# Patient Record
Sex: Female | Born: 1970
Health system: Southern US, Community
[De-identification: ages and names within clinical notes are randomized; demographics above are authoritative.]

## PROBLEM LIST (undated history)

## (undated) DIAGNOSIS — K219 Gastro-esophageal reflux disease without esophagitis: Secondary | ICD-10-CM

## (undated) DIAGNOSIS — J45909 Unspecified asthma, uncomplicated: Secondary | ICD-10-CM

## (undated) DIAGNOSIS — I10 Essential (primary) hypertension: Secondary | ICD-10-CM

## (undated) DIAGNOSIS — F419 Anxiety disorder, unspecified: Secondary | ICD-10-CM

## (undated) DIAGNOSIS — F32A Depression, unspecified: Secondary | ICD-10-CM

## (undated) DIAGNOSIS — F329 Major depressive disorder, single episode, unspecified: Secondary | ICD-10-CM

## (undated) DIAGNOSIS — C539 Malignant neoplasm of cervix uteri, unspecified: Secondary | ICD-10-CM

## (undated) DIAGNOSIS — I6522 Occlusion and stenosis of left carotid artery: Secondary | ICD-10-CM

## (undated) DIAGNOSIS — E119 Type 2 diabetes mellitus without complications: Secondary | ICD-10-CM

## (undated) DIAGNOSIS — M314 Aortic arch syndrome [Takayasu]: Secondary | ICD-10-CM

## (undated) DIAGNOSIS — E079 Disorder of thyroid, unspecified: Secondary | ICD-10-CM

## (undated) HISTORY — DX: Anxiety disorder, unspecified: F41.9

## (undated) HISTORY — PX: HEMORRHOID SURGERY: SHX153

## (undated) HISTORY — DX: Type 2 diabetes mellitus without complications: E11.9

## (undated) HISTORY — DX: Depression, unspecified: F32.A

## (undated) HISTORY — DX: Disorder of thyroid, unspecified: E07.9

---
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
## 2004-07-15 DIAGNOSIS — B181 Chronic viral hepatitis B without delta-agent: Secondary | ICD-10-CM

## 2004-07-15 DIAGNOSIS — B1910 Unspecified viral hepatitis B without hepatic coma: Secondary | ICD-10-CM | POA: Insufficient documentation

## 2004-07-15 HISTORY — DX: Unspecified viral hepatitis B without hepatic coma: B19.10

## 2004-07-15 HISTORY — DX: Chronic viral hepatitis B without delta-agent: B18.1

## 2011-11-08 DIAGNOSIS — C539 Malignant neoplasm of cervix uteri, unspecified: Secondary | ICD-10-CM | POA: Insufficient documentation

## 2011-11-08 HISTORY — DX: Malignant neoplasm of cervix uteri, unspecified: C53.9

## 2012-06-08 ENCOUNTER — Other Ambulatory Visit (HOSPITAL_BASED_OUTPATIENT_CLINIC_OR_DEPARTMENT_OTHER): Payer: Self-pay | Admitting: Family Medicine

## 2012-06-08 DIAGNOSIS — R0989 Other specified symptoms and signs involving the circulatory and respiratory systems: Secondary | ICD-10-CM

## 2012-06-11 ENCOUNTER — Ambulatory Visit (HOSPITAL_BASED_OUTPATIENT_CLINIC_OR_DEPARTMENT_OTHER): Payer: BC Managed Care – PPO

## 2012-06-13 ENCOUNTER — Ambulatory Visit (HOSPITAL_BASED_OUTPATIENT_CLINIC_OR_DEPARTMENT_OTHER)
Admission: RE | Admit: 2012-06-13 | Discharge: 2012-06-13 | Disposition: A | Discharge status: Home / Self Care | Payer: BC Managed Care – PPO | Source: Ambulatory Visit | Attending: Family Medicine | Admitting: Family Medicine

## 2012-06-13 DIAGNOSIS — I6529 Occlusion and stenosis of unspecified carotid artery: Secondary | ICD-10-CM | POA: Insufficient documentation

## 2012-06-13 DIAGNOSIS — I10 Essential (primary) hypertension: Secondary | ICD-10-CM | POA: Insufficient documentation

## 2012-06-13 DIAGNOSIS — R0989 Other specified symptoms and signs involving the circulatory and respiratory systems: Secondary | ICD-10-CM | POA: Insufficient documentation

## 2012-06-21 ENCOUNTER — Encounter (HOSPITAL_COMMUNITY): Payer: Self-pay | Admitting: Emergency Medicine

## 2012-06-21 ENCOUNTER — Emergency Department (HOSPITAL_COMMUNITY): Payer: BC Managed Care – PPO

## 2012-06-21 ENCOUNTER — Emergency Department (HOSPITAL_COMMUNITY)
Admission: EM | Admit: 2012-06-21 | Discharge: 2012-06-22 | Disposition: A | Discharge status: Home / Self Care | Payer: BC Managed Care – PPO | Attending: Emergency Medicine | Admitting: Emergency Medicine

## 2012-06-21 DIAGNOSIS — Z8541 Personal history of malignant neoplasm of cervix uteri: Secondary | ICD-10-CM | POA: Insufficient documentation

## 2012-06-21 DIAGNOSIS — R079 Chest pain, unspecified: Secondary | ICD-10-CM | POA: Insufficient documentation

## 2012-06-21 DIAGNOSIS — J45909 Unspecified asthma, uncomplicated: Secondary | ICD-10-CM | POA: Insufficient documentation

## 2012-06-21 DIAGNOSIS — Z7982 Long term (current) use of aspirin: Secondary | ICD-10-CM | POA: Insufficient documentation

## 2012-06-21 DIAGNOSIS — Z8679 Personal history of other diseases of the circulatory system: Secondary | ICD-10-CM | POA: Insufficient documentation

## 2012-06-21 DIAGNOSIS — Z79899 Other long term (current) drug therapy: Secondary | ICD-10-CM | POA: Insufficient documentation

## 2012-06-21 DIAGNOSIS — M314 Aortic arch syndrome [Takayasu]: Secondary | ICD-10-CM | POA: Insufficient documentation

## 2012-06-21 DIAGNOSIS — I10 Essential (primary) hypertension: Secondary | ICD-10-CM | POA: Insufficient documentation

## 2012-06-21 HISTORY — DX: Malignant neoplasm of cervix uteri, unspecified: C53.9

## 2012-06-21 HISTORY — DX: Essential (primary) hypertension: I10

## 2012-06-21 HISTORY — DX: Occlusion and stenosis of left carotid artery: I65.22

## 2012-06-21 HISTORY — DX: Unspecified asthma, uncomplicated: J45.909

## 2012-06-21 HISTORY — DX: Aortic arch syndrome (takayasu): M31.4

## 2012-06-21 LAB — COMPREHENSIVE METABOLIC PANEL
ALT: 32 U/L (ref 0–35)
AST: 26 U/L (ref 0–37)
Albumin: 4 g/dL (ref 3.5–5.2)
Alkaline Phosphatase: 113 U/L (ref 39–117)
BUN: 14 mg/dL (ref 6–23)
CO2: 26 mEq/L (ref 19–32)
Calcium: 9.7 mg/dL (ref 8.4–10.5)
Chloride: 101 mEq/L (ref 96–112)
Creatinine, Ser: 0.58 mg/dL (ref 0.50–1.10)
GFR calc Af Amer: >90 mL/min (ref >90)
GFR calc non Af Amer: >90 mL/min (ref >90)
Glucose, Bld: 116 mg/dL — ABNORMAL HIGH (ref 70–99)
Potassium: 3.4 mEq/L — ABNORMAL LOW (ref 3.5–5.1)
Sodium: 138 mEq/L (ref 135–145)
Total Bilirubin: 0.4 mg/dL (ref 0.3–1.2)
Total Protein: 8.1 g/dL (ref 6.0–8.3)

## 2012-06-21 LAB — SEDIMENTATION RATE: Sed Rate: 37 mm/hr — ABNORMAL HIGH (ref 0–22)

## 2012-06-21 LAB — CBC WITH DIFFERENTIAL/PLATELET
Basophils Absolute: 0 10*3/uL (ref 0.0–0.1)
Basophils Relative: 0 % (ref 0–1)
Eosinophils Absolute: 0 10*3/uL (ref 0.0–0.7)
Eosinophils Relative: 1 % (ref 0–5)
HCT: 37.9 % (ref 36.0–46.0)
Hemoglobin: 13 g/dL (ref 12.0–15.0)
Lymphocytes Relative: 20 % (ref 12–46)
Lymphs Abs: 0.6 10*3/uL — ABNORMAL LOW (ref 0.7–4.0)
MCH: 28.3 pg (ref 26.0–34.0)
MCHC: 34.3 g/dL (ref 30.0–36.0)
MCV: 82.4 fL (ref 78.0–100.0)
Monocytes Absolute: 0.3 10*3/uL (ref 0.1–1.0)
Monocytes Relative: 9 % (ref 3–12)
Neutro Abs: 2.1 10*3/uL (ref 1.7–7.7)
Neutrophils Relative %: 70 % (ref 43–77)
Platelets: 241 10*3/uL (ref 150–400)
RBC: 4.6 MIL/uL (ref 3.87–5.11)
RDW: 13.2 % (ref 11.5–15.5)
WBC: 3 10*3/uL — ABNORMAL LOW (ref 4.0–10.5)

## 2012-06-21 LAB — POCT I-STAT TROPONIN I: Troponin i, poc: 0.01 ng/mL (ref 0.00–0.08)

## 2012-06-21 MED ORDER — METHYLPREDNISOLONE SODIUM SUCC 125 MG IJ SOLR
125.0000 mg | Freq: Once | INTRAMUSCULAR | Status: AC
  Administered 2012-06-21: 125 mg via INTRAVENOUS
  Filled 2012-06-21: qty 2

## 2012-06-21 MED ORDER — IOHEXOL 350 MG/ML SOLN
100.0000 mL | Freq: Once | INTRAVENOUS | Status: AC | PRN
  Administered 2012-06-21: 100 mL via INTRAVENOUS

## 2012-06-21 NOTE — ED Notes (Signed)
Pt given sandwich and water per Dr. Conley Rolls.

## 2012-06-21 NOTE — ED Provider Notes (Signed)
History    42yF with chest heaviness. Onset about a weak ago. Vague sensation of pressure substernally. Intermittent w/o appreciable exacerbating factors. Does not radiate. No SOB. No fever or chills. No cough. No unusual leg pain or swelling. Paperwork with pt with question of Takayasu's arteritis based on recent carotid doppler. On review of symptoms pt c/o intermittent blurred vision b/l. Feels generally tired. Soreness in R neck. Occasionally arms feel heavy and numb. Hx of cervical CA s/p chemo/radiation. No unusual leg pain or swelling. Denies hx of DVT/PE.   CSN: [id number]  Arrival date & time 06/21/12  1819   First MD Initiated Contact with Patient 06/21/12 1910      Chief Complaint  Patient presents with  . Chest Pain    (Consider location/radiation/quality/duration/timing/severity/associated sxs/prior treatment) HPI  Past Medical History  Diagnosis Date  . Hypertension   . Cervical cancer   . Left carotid artery occlusion   . Takayasu's arteritis   . Asthma   . Cervical cancer     History reviewed. No pertinent past surgical history.  No family history on file.  History  Substance Use Topics  . Smoking status: Never Smoker   . Smokeless tobacco: Not on file  . Alcohol Use: No    OB History   Grav Para Term Preterm Abortions TAB SAB Ect Mult Living                  Review of Systems  All systems reviewed and negative, other than as noted in HPI.   Allergies  Review of patient's allergies indicates no known allergies.  Home Medications   Current Outpatient Rx  Name  Route  Sig  Dispense  Refill  . amLODipine (NORVASC) 5 MG tablet   Oral   Take 5 mg by mouth daily.         Marland Kitchen aspirin EC 81 MG tablet   Oral   Take 81 mg by mouth daily.         . calcium-vitamin D (OSCAL WITH D) 500-200 MG-UNIT per tablet   Oral   Take 1 tablet by mouth daily.         Marland Kitchen lisinopril (PRINIVIL,ZESTRIL) 20 MG tablet   Oral   Take 20 mg by mouth 2 (two)  times daily.         Marland Kitchen omeprazole (PRILOSEC) 20 MG capsule   Oral   Take 20 mg by mouth daily.         . potassium chloride (MICRO-K) 10 MEQ CR capsule   Oral   Take 10 mEq by mouth daily.           BP 200/81  Pulse 93  Temp(Src) 98.2 F (36.8 C)  Resp 17  SpO2 99%  Physical Exam  Nursing note and vitals reviewed. Constitutional: She is oriented to person, place, and time. She appears well-developed and well-nourished. No distress.  HENT:  Head: Normocephalic and atraumatic.  Eyes: Conjunctivae are normal. Right eye exhibits no discharge. Left eye exhibits no discharge.  Neck: Neck supple.  Cardiovascular: Normal rate, regular rhythm and normal heart sounds.  Exam reveals no gallop and no friction rub.   No murmur heard. Bilateral carotid bruits. No heart murmur appreciated. No abdominal bruit. I could not palpate a carotid pulse b/l. Good brachial pulses b/l.   Pulmonary/Chest: Effort normal and breath sounds normal. No respiratory distress.  Abdominal: Soft. She exhibits no distension. There is no tenderness.  Musculoskeletal: She exhibits no edema  and no tenderness.  Neurological: She is alert and oriented to person, place, and time. No cranial nerve deficit. She exhibits normal muscle tone. Coordination normal.  Skin: Skin is warm and dry. No rash noted.  No concerning skin lesions noted  Psychiatric: She has a normal mood and affect. Her behavior is normal. Thought content normal.    ED Course  Procedures (including critical care time)  Labs Reviewed  COMPREHENSIVE METABOLIC PANEL - Abnormal; Notable for the following:    Potassium 3.4 (*)    Glucose, Bld 116 (*)    All other components within normal limits  CBC WITH DIFFERENTIAL - Abnormal; Notable for the following:    WBC 3.0 (*)    Lymphs Abs 0.6 (*)    All other components within normal limits  SEDIMENTATION RATE - Abnormal; Notable for the following:    Sed Rate 37 (*)    All other components  within normal limits  C-REACTIVE PROTEIN  POCT I-STAT TROPONIN I   Dg Chest 2 View  06/21/2012  *RADIOLOGY REPORT*  Clinical Data: Mid chest pain.  CHEST - 2 VIEW  Comparison: None.  Findings: Lungs clear. Heart size upper normal. No pneumothorax or pleural effusion.  IMPRESSION: No acute finding.   Original Report Authenticated By: Holley Dexter, M.D.    Ct Angio Chest W/cm &/or Wo Cm  06/21/2012  *RADIOLOGY REPORT*  Clinical Data: Chest pain, concern for patchy Isovue arteritis  CT ANGIOGRAPHY CHEST  Technique:  Multidetector CT imaging of the chest using the standard protocol during bolus administration of intravenous contrast. Multiplanar reconstructed images including MIPs were obtained and reviewed to evaluate the vascular anatomy.  Contrast: OMNIPAQUE IOHEXOL 350 MG/ML SOLN  Comparison: Chest radiograph 06/21/2012  Findings: In the ascending aorta is normal caliber at 35 x 34 mm. There is circumferential calcification along the ascending aorta. There is inflammation along the aortic arch surrounding the right brachiocephalic artery and the origin of the left subclavian artery.  The right carotid artery has a separate origin from the aorta and does not opacify with contrast consistent with occlusion.  This is seen on the axial image 13 through six and coronal image 35.  There is inflammation surrounding the origin of the right common carotid artery with only a thin wisp of contrast extending through the right common carotid artery proximally.  The more distal artery is not evaluated.   The right proximal common carotid artery stenosis is best seen on axial image six and coronal image 30.  The right vertebral artery is patent.   The left vertebral artery originates from the left common carotid artery.  Descending thoracic aorta is normal caliber.  The coronary arteries are grossly normal.  No pericardial fluid.  There is no focal parenchymal pulmonary lesion.  No axillary lymphadenopathy.  No  mediastinal adenopathy.  Limited view of the upper abdomen is unremarkable.  IMPRESSION:  1.  Chronic complete occlusion of the left carotid artery at the origin secondary to inflammation / arteritis. 2.  Severe narrowing at the origin of the right common carotid artery. 3.  More generalized inflammation surrounding the origin of the brachiocephalic artery and left subclavian artery. 4.  Vertebral arteries are patent.  Findings conveyed to Dr. Juleen China on 06/21/2012 at 2210 hours   Original Report Authenticated By: Genevive Bi, M.D.    EKG:  Rhythm: sinus tachycardia Vent. rate 104 BPM PR interval 146 ms QRS duration 90 ms QT/QTc 350/460 ms ST segments: NS ST changes  1. Takayasu's arteritis       MDM  42 year old female with CP. Consider ACS, infectious, PE, aortic dissection, but doubt. Interesting case in that likely has Takayasu's arteritis. CT with occluded carotids bilaterally, but seems to be getting good compensatory vertebral flow. Pt with multiple vague complaints which may be atributable to this. Case discussed with vascular surgery and neurology. From vascular surgery standpoint, very rarely do they surgically intervene. Pt with no focal neuro deficits. Will start on steroids which will eventually need to be tapered.  Neurology recommending ASA.  Hypertension and will need to be very careful in reduction given tenuous cerebral flow. Very concerning appearing CT, but no acute intervention aside from steroids which can be done as outpt. For follow-up, rheumatology probably most appropriate and will defer to PCP for preference. Pt has already been referred to vascular surgeon at [facility] per papers which pt brought with her to ED.         Raeford Razor, MD 06/22/12 [phone]

## 2012-06-21 NOTE — ED Notes (Signed)
Her husband works out of town has child with her no family

## 2012-06-21 NOTE — ED Notes (Signed)
Pt presents from doctors office for further evaluation of generalized fatigue.  Pt had testing done that showed possible Takayasu arteritis- pt wants further evaluation of this- complaining of heaviness in chest at present, found sinus tach on monitor.  Pt receiving chemo and radiation at present for cervical cancer.  Pt has had increased fatigue and heaviness in her chest for the past week.  On cardiac monitor, NAD noted, will continue to monitor.

## 2012-06-21 NOTE — ED Notes (Signed)
Cp that started yesterday went to dr today had ekg and was found to have ? Takayasu artertis has been having chemo and radiation for cervical ca

## 2012-06-22 DIAGNOSIS — M314 Aortic arch syndrome [Takayasu]: Secondary | ICD-10-CM

## 2012-06-22 DIAGNOSIS — R079 Chest pain, unspecified: Secondary | ICD-10-CM

## 2012-06-22 LAB — C-REACTIVE PROTEIN: CRP: 0.7 mg/dL — ABNORMAL HIGH (ref <0.60)

## 2012-06-22 MED ORDER — ASPIRIN EC 325 MG PO TBEC: 325.0000 mg | DELAYED_RELEASE_TABLET | Freq: Every day | ORAL | Status: DC

## 2012-06-22 MED ORDER — PREDNISONE 50 MG PO TABS: 50.0000 mg | ORAL_TABLET | Freq: Every day | ORAL | Status: DC

## 2012-06-22 NOTE — Consult Note (Addendum)
Reason for Consult:Takayasu's arteritis Referring Physician: Juleen China, S  CC: Generalized fatigue  History is obtained from:Patient  HPI: Molly Hardin is a 42 y.o. female with a history of approximately 3 months of generalized fatigue, though worsened over the past few days. She states that she gets a bilateral sensation of heaviness and feels lightheaded. This then will pass. She denies vision loss, numbness, unilateral weakness, or other unilateral symptoms. She was evaluated by her PCP and had an ultrasound which prompted her referral th the emergency department. Here, she had a CT angiogram of the chest which shows occlusion of the left CCA and almost complete occlusion with a tiny persistent lumen in the right CCA. She has patent vertebrals as visualized on this study.   Of note, she has undergone radiation and chemotherapy for cervical cancer, but thinks that the radiation was localized to her pelvis.   ROS: A 14 point ROS was performed and is negative except as noted in the HPI.  Past Medical History  Diagnosis Date  . Hypertension   . Cervical cancer   . Left carotid artery occlusion   . Takayasu's arteritis   . Asthma   . Cervical cancer     Exam: Current vital signs: BP 105/63  Pulse 97  Temp(Src) 98.2 F (36.8 C)  Resp 17  SpO2 99% Vital signs in last 24 hours: Temp:  [98.2 F (36.8 C)] 98.2 F (36.8 C) (03/06 1841) Pulse Rate:  [93-99] 97 (03/06 2330) Resp:  [16-17] 17 (03/06 2037) BP: (105-204)/(63-106) 105/63 mmHg (03/06 2330) SpO2:  [99 %-100 %] 99 % (03/06 2330)  General: in bed, nad CV: RRR Mental Status: Patient is awake, alert, oriented to person, place, month, year, and situation. Immediate and remote memory are intact. Patient is able to give a clear and coherent history. No signs of aphasia.  Cranial Nerves: II: Visual Fields are full. Pupils are equal, round, and reactive to light.  Discs are sharp. III,IV, VI: EOMI without ptosis or diploplia.   V: Facial sensation is symmetric to temperature VII: Facial movement is symmetric.  VIII: hearing is intact to voice X: Uvula elevates symmetrically XI: Shoulder shrug is symmetric. XII: tongue is midline without atrophy or fasciculations.  Motor: Tone is normal. Bulk is normal. 5/5 strength was present in all four extremities.  Sensory: Sensation is symmetric to light touch and temperature in the arms and legs. Deep Tendon Reflexes: 2+ and symmetric in the biceps and patellae.  Plantars: Toes are downgoing bilaterally.  Cerebellar: FNF and HKS are intact bilaterally Gait: Patient has a stable casual gait. Able to tandem.   I have reviewed labs in epic and the results pertinent to this consultation are: ESR elevated at 37.  Mild leukopenia  I have reviewed the images obtained:CTA chest. Occluded leftcarotid,  tiny lumen remains patent in right carotid. Patent vertebrals at this level.   Impression: 42 yo F with CT evidence of inflamation surrounding large vessels as they come off of the aorta consistent with Takayasu's arteritis. Though, given her imaging, I do feel that she is at risk for stroke, I do not know of any specific therapy to offer her as an inpatient from a neurological perspective, especially if vascular surgery does not feel that they have any interventions to offer. I did advise her to return to the ER immediately with any signs or symptoms of stroke(unilateral weakness, numbness, vision loss difficulty talking, etc).   Though I would be hesitant to be too aggressive  with BP management, she appeaerd to be perfusing well with a systolic of 105 while I was in the room.   Recommendations: 1) Patient needs to establish with rheumatology 2) Agree with IM recommendation of starting prednisone 1mg /kg 3) There have been limited comparative trials with antiplatelets in TA, and it does appear to confer some benefit. Given her degree of stenosis, I would advocate continuing  antiplatelet therapy.  4) PPI for GI prophylaxis while on steroids.     Ritta Slot, MD [facility] [phone]  If 7pm- 7am, please page neurology on call at [phone].

## 2012-06-22 NOTE — Consult Note (Signed)
[facility] History and Physical  Molly Hardin RUE:[id number] DOB: 06/01/1970    PCP:   [name], MD.  Chief Complaint: Weakness and upper extremity pain.  HPI: Molly Hardin is an 42 y.o. female with history of hypertension, cervical cancer underwent chemotherapy and radiation therapy, asthma, presents to her primary care physician with no history of progressive fatigue with upper extremity pain, and general malaise. Her primary care physician obtained a carotid Doppler which shows inflammation and occlusion of her carotid artery consistent with Takayasu's arteritis. She was referred to the emergency room whereupon a CT angiogram of the chest revealed occlusion of the left CCA and almost complete occlusion with a tiny persistent lumen in the right CCA. She has patent vertebrals as visualized on this study.  She denied any slurred speech visual changes, facial paresthesia, upper or lower extremity focal weakness. Hospital was asked for medical consult on this patient.  EDP has already consulted vascular surgery and Dr. Valentina Shaggy did not feel any surgical intervention is indicated.   Rewiew of Systems:  Constitutional: Negative for significant weight loss or weight gain Eyes: Negative for eye pain, redness and discharge, diplopia, visual changes, or flashes of light. ENMT: Negative for ear pain, hoarseness, nasal congestion, sinus pressure and sore throat. No headaches; tinnitus, drooling, or problem swallowing. Cardiovascular: Negative for chest pain, palpitations, diaphoresis, dyspnea and peripheral edema. ; No orthopnea, PND Respiratory: Negative for cough, hemoptysis, wheezing and stridor. No pleuritic chestpain. Gastrointestinal: Negative for nausea, vomiting, diarrhea, constipation, abdominal pain, melena, blood in stool, hematemesis, jaundice and rectal bleeding.    Genitourinary: Negative for frequency, dysuria, incontinence,flank pain and hematuria; Musculoskeletal: Negative  for swelling and trauma.;  Skin: . Negative for pruritus, rash, abrasions, bruising and skin lesion.; ulcerations Neuro: Negative for headache, lightheadedness and neck stiffness. Negative for weakness, altered level of consciousness , altered mental status, extremity weakness, burning feet, involuntary movement, seizure and syncope.  Psych: negative for anxiety, depression, insomnia, tearfulness, panic attacks, hallucinations, paranoia, suicidal or homicidal ideation    Past Medical History  Diagnosis Date  . Hypertension   . Cervical cancer   . Left carotid artery occlusion   . Takayasu's arteritis   . Asthma   . Cervical cancer     History reviewed. No pertinent past surgical history.  Medications:  HOME MEDS: Prior to Admission medications   Medication Sig Start Date End Date Taking? Authorizing Provider  amLODipine (NORVASC) 5 MG tablet Take 5 mg by mouth daily.   Yes Historical Provider, MD  aspirin EC 81 MG tablet Take 81 mg by mouth daily.   Yes Historical Provider, MD  calcium-vitamin D (OSCAL WITH D) 500-200 MG-UNIT per tablet Take 1 tablet by mouth daily.   Yes Historical Provider, MD  lisinopril (PRINIVIL,ZESTRIL) 20 MG tablet Take 20 mg by mouth 2 (two) times daily.   Yes Historical Provider, MD  omeprazole (PRILOSEC) 20 MG capsule Take 20 mg by mouth daily.   Yes Historical Provider, MD  potassium chloride (MICRO-K) 10 MEQ CR capsule Take 10 mEq by mouth daily.   Yes Historical Provider, MD  aspirin EC 325 MG tablet Take 1 tablet (325 mg total) by mouth daily. 06/22/12   Raeford Razor, MD  predniSONE (DELTASONE) 50 MG tablet Take 1 tablet (50 mg total) by mouth daily. 06/22/12   Raeford Razor, MD     Allergies:  No Known Allergies  Social History:   reports that she has never smoked. She does not have any smokeless  tobacco history on file. She reports that she does not drink alcohol or use illicit drugs.  Family History: No family history on file.   Physical  Exam: Filed Vitals:   06/21/12 2032 06/21/12 2035 06/21/12 2037 06/21/12 2330  BP: 197/72 200/81  105/63  Pulse:   93 97  Temp:      Resp:   17   SpO2:   99% 99%   Blood pressure 105/63, pulse 97, temperature 98.2 F (36.8 C), resp. rate 17, SpO2 99.00%.  GEN:  Pleasant patient lying in the stretcher in no acute distress; cooperative with exam. PSYCH:  alert and oriented x4; does not appear anxious or depressed; affect is appropriate. HEENT: Mucous membranes pink and anicteric; PERRLA; EOM intact; no cervical lymphadenopathy nor thyromegaly she has bilateral carotid bruits with left greater than right no JVD; There were no stridor. Neck is very supple. Breasts:: Not examined CHEST WALL: No tenderness CHEST: Normal respiration, clear to auscultation bilaterally.  HEART: Regular rate and rhythm.  There are no murmur, rub, or gallops.   BACK: No kyphosis or scoliosis; no CVA tenderness ABDOMEN: soft and non-tender; no masses, no organomegaly, normal abdominal bowel sounds; no pannus; no intertriginous candida. There is no rebound and no distention. Rectal Exam: Not done EXTREMITIES: No bone or joint deformity; age-appropriate arthropathy of the hands and knees; no edema; no ulcerations.  There is no calf tenderness. Genitalia: not examined PULSES: She has decreased radial pulses bilaterally SKIN: Normal hydration no rash or ulceration CNS: Cranial nerves 2-12 grossly intact no focal lateralizing neurologic deficit.  Speech is fluent; uvula elevated with phonation, facial symmetry and tongue midline. DTR are normal bilaterally, cerebella exam is intact, barbinski is negative and strengths are equaled bilaterally.  No sensory loss.   Labs on Admission:  Basic Metabolic Panel:  Recent Labs Lab 06/21/12 1844  NA 138  K 3.4*  CL 101  CO2 26  GLUCOSE 116*  BUN 14  CREATININE 0.58  CALCIUM 9.7   Liver Function Tests:  Recent Labs Lab 06/21/12 1844  AST 26  ALT 32  ALKPHOS 113   BILITOT 0.4  PROT 8.1  ALBUMIN 4.0   No results found for this basename: LIPASE, AMYLASE,  in the last 168 hours No results found for this basename: AMMONIA,  in the last 168 hours CBC:  Recent Labs Lab 06/21/12 1844  WBC 3.0*  NEUTROABS 2.1  HGB 13.0  HCT 37.9  MCV 82.4  PLT 241   Cardiac Enzymes: No results found for this basename: CKTOTAL, CKMB, CKMBINDEX, TROPONINI,  in the last 168 hours  CBG: No results found for this basename: GLUCAP,  in the last 168 hours   Radiological Exams on Admission: Dg Chest 2 View  06/21/2012  *RADIOLOGY REPORT*  Clinical Data: Mid chest pain.  CHEST - 2 VIEW  Comparison: None.  Findings: Lungs clear. Heart size upper normal. No pneumothorax or pleural effusion.  IMPRESSION: No acute finding.   Original Report Authenticated By: Holley Dexter, M.D.    Ct Angio Chest W/cm &/or Wo Cm  06/21/2012  *RADIOLOGY REPORT*  Clinical Data: Chest pain, concern for patchy Isovue arteritis  CT ANGIOGRAPHY CHEST  Technique:  Multidetector CT imaging of the chest using the standard protocol during bolus administration of intravenous contrast. Multiplanar reconstructed images including MIPs were obtained and reviewed to evaluate the vascular anatomy.  Contrast: OMNIPAQUE IOHEXOL 350 MG/ML SOLN  Comparison: Chest radiograph 06/21/2012  Findings: In the ascending aorta is normal  caliber at 35 x 34 mm. There is circumferential calcification along the ascending aorta. There is inflammation along the aortic arch surrounding the right brachiocephalic artery and the origin of the left subclavian artery.  The right carotid artery has a separate origin from the aorta and does not opacify with contrast consistent with occlusion.  This is seen on the axial image 13 through six and coronal image 35.  There is inflammation surrounding the origin of the right common carotid artery with only a thin wisp of contrast extending through the right common carotid artery proximally.   The more distal artery is not evaluated.   The right proximal common carotid artery stenosis is best seen on axial image six and coronal image 30.  The right vertebral artery is patent.   The left vertebral artery originates from the left common carotid artery.  Descending thoracic aorta is normal caliber.  The coronary arteries are grossly normal.  No pericardial fluid.  There is no focal parenchymal pulmonary lesion.  No axillary lymphadenopathy.  No mediastinal adenopathy.  Limited view of the upper abdomen is unremarkable.  IMPRESSION:  1.  Chronic complete occlusion of the left carotid artery at the origin secondary to inflammation / arteritis. 2.  Severe narrowing at the origin of the right common carotid artery. 3.  More generalized inflammation surrounding the origin of the brachiocephalic artery and left subclavian artery. 4.  Vertebral arteries are patent.  Findings conveyed to Dr. Juleen China on 06/21/2012 at 2210 hours   Original Report Authenticated By: Genevive Bi, M.D.     Assessment/Plan:  This patient most likely has Takayasu's arteritis as evidenced by chronic inflammation and occlusion of large size arteries.  She will need by mouth prednisone which I recommend 60 mg per day at least. I would recommend consulting neurology to see if any other intervention needed as inpatient. If not, I would recommend starting her on prednisone, give specific indication as to when to return to the emergency room. She should followup with her primary care physician and a referral to rheumatologist is appropriate. Thank you for consulting me on this very interesting patient. She does have elevated blood pressure but I would recommend to allow permissive hypertension given her known occlusion with decreased cerebral blood flow.   Other plans as per orders.  Code Status: FULL Unk Lightning, MD. [facility] Pager [phone] 7pm to 7am.  06/22/2012, 1:20 AM

## 2012-07-26 ENCOUNTER — Other Ambulatory Visit: Payer: Self-pay | Admitting: Gastroenterology

## 2012-07-26 DIAGNOSIS — R1310 Dysphagia, unspecified: Secondary | ICD-10-CM

## 2012-08-03 ENCOUNTER — Other Ambulatory Visit: Payer: Self-pay | Admitting: Gastroenterology

## 2012-08-03 ENCOUNTER — Ambulatory Visit
Admission: RE | Admit: 2012-08-03 | Discharge: 2012-08-03 | Disposition: A | Discharge status: Home / Self Care | Payer: BC Managed Care – PPO | Source: Ambulatory Visit | Attending: Gastroenterology | Admitting: Gastroenterology

## 2012-08-03 DIAGNOSIS — R1310 Dysphagia, unspecified: Secondary | ICD-10-CM

## 2013-01-11 ENCOUNTER — Other Ambulatory Visit: Payer: Self-pay | Admitting: Gastroenterology

## 2013-01-11 DIAGNOSIS — R109 Unspecified abdominal pain: Secondary | ICD-10-CM

## 2013-01-18 ENCOUNTER — Ambulatory Visit
Admission: RE | Admit: 2013-01-18 | Discharge: 2013-01-18 | Disposition: A | Discharge status: Home / Self Care | Payer: BC Managed Care – PPO | Source: Ambulatory Visit | Attending: Gastroenterology | Admitting: Gastroenterology

## 2013-01-18 DIAGNOSIS — R109 Unspecified abdominal pain: Secondary | ICD-10-CM

## 2013-04-22 DIAGNOSIS — M314 Aortic arch syndrome [Takayasu]: Secondary | ICD-10-CM

## 2013-04-22 HISTORY — DX: Aortic arch syndrome (takayasu): M31.4

## 2013-11-25 ENCOUNTER — Other Ambulatory Visit: Payer: Self-pay | Admitting: Family Medicine

## 2013-11-25 DIAGNOSIS — G44009 Cluster headache syndrome, unspecified, not intractable: Secondary | ICD-10-CM

## 2013-11-30 ENCOUNTER — Ambulatory Visit
Admission: RE | Admit: 2013-11-30 | Discharge: 2013-11-30 | Disposition: A | Discharge status: Home / Self Care | Payer: BC Managed Care – PPO | Source: Ambulatory Visit | Attending: Family Medicine | Admitting: Family Medicine

## 2013-11-30 DIAGNOSIS — G44009 Cluster headache syndrome, unspecified, not intractable: Secondary | ICD-10-CM

## 2014-04-24 ENCOUNTER — Other Ambulatory Visit: Payer: Self-pay | Admitting: Family Medicine

## 2014-04-24 DIAGNOSIS — Z8541 Personal history of malignant neoplasm of cervix uteri: Secondary | ICD-10-CM

## 2014-04-24 DIAGNOSIS — R1900 Intra-abdominal and pelvic swelling, mass and lump, unspecified site: Secondary | ICD-10-CM

## 2014-05-07 ENCOUNTER — Ambulatory Visit
Admission: RE | Admit: 2014-05-07 | Discharge: 2014-05-07 | Disposition: A | Discharge status: Home / Self Care | Payer: BLUE CROSS/BLUE SHIELD | Source: Ambulatory Visit | Attending: Family Medicine | Admitting: Family Medicine

## 2014-05-07 DIAGNOSIS — Z8541 Personal history of malignant neoplasm of cervix uteri: Secondary | ICD-10-CM

## 2014-05-07 DIAGNOSIS — R1900 Intra-abdominal and pelvic swelling, mass and lump, unspecified site: Secondary | ICD-10-CM

## 2014-05-07 MED ORDER — IOHEXOL 300 MG/ML  SOLN
100.0000 mL | Freq: Once | INTRAMUSCULAR | Status: AC | PRN
  Administered 2014-05-07: 100 mL via INTRAVENOUS

## 2014-06-06 ENCOUNTER — Encounter (HOSPITAL_BASED_OUTPATIENT_CLINIC_OR_DEPARTMENT_OTHER): Payer: BC Managed Care – PPO

## 2014-08-12 ENCOUNTER — Encounter (HOSPITAL_BASED_OUTPATIENT_CLINIC_OR_DEPARTMENT_OTHER): Payer: Medicaid Other

## 2014-09-05 ENCOUNTER — Ambulatory Visit (HOSPITAL_BASED_OUTPATIENT_CLINIC_OR_DEPARTMENT_OTHER): Payer: BLUE CROSS/BLUE SHIELD | Attending: Family Medicine

## 2015-06-08 ENCOUNTER — Ambulatory Visit
Admission: RE | Admit: 2015-06-08 | Discharge: 2015-06-08 | Disposition: A | Discharge status: Home / Self Care | Payer: BLUE CROSS/BLUE SHIELD | Source: Ambulatory Visit | Attending: Family Medicine | Admitting: Family Medicine

## 2015-06-08 ENCOUNTER — Other Ambulatory Visit: Payer: Self-pay | Admitting: Family Medicine

## 2015-06-08 DIAGNOSIS — M546 Pain in thoracic spine: Secondary | ICD-10-CM

## 2015-06-18 DIAGNOSIS — I10 Essential (primary) hypertension: Secondary | ICD-10-CM | POA: Diagnosis not present

## 2015-07-22 DIAGNOSIS — E876 Hypokalemia: Secondary | ICD-10-CM

## 2015-07-22 DIAGNOSIS — R109 Unspecified abdominal pain: Secondary | ICD-10-CM

## 2015-07-22 DIAGNOSIS — O24419 Gestational diabetes mellitus in pregnancy, unspecified control: Secondary | ICD-10-CM

## 2015-07-22 DIAGNOSIS — K2100 Gastro-esophageal reflux disease with esophagitis, without bleeding: Secondary | ICD-10-CM | POA: Insufficient documentation

## 2015-07-22 DIAGNOSIS — R1310 Dysphagia, unspecified: Secondary | ICD-10-CM

## 2015-07-22 DIAGNOSIS — R0989 Other specified symptoms and signs involving the circulatory and respiratory systems: Secondary | ICD-10-CM | POA: Insufficient documentation

## 2015-07-22 DIAGNOSIS — M797 Fibromyalgia: Secondary | ICD-10-CM

## 2015-07-22 DIAGNOSIS — N912 Amenorrhea, unspecified: Secondary | ICD-10-CM

## 2015-07-22 DIAGNOSIS — M545 Low back pain, unspecified: Secondary | ICD-10-CM

## 2015-07-22 DIAGNOSIS — I10 Essential (primary) hypertension: Secondary | ICD-10-CM

## 2015-07-22 HISTORY — DX: Amenorrhea, unspecified: N91.2

## 2015-07-22 HISTORY — DX: Other specified symptoms and signs involving the circulatory and respiratory systems: R09.89

## 2015-07-22 HISTORY — DX: Fibromyalgia: M79.7

## 2015-07-22 HISTORY — DX: Unspecified abdominal pain: R10.9

## 2015-07-22 HISTORY — DX: Low back pain, unspecified: M54.50

## 2015-07-22 HISTORY — DX: Dysphagia, unspecified: R13.10

## 2015-07-22 HISTORY — DX: Gestational diabetes mellitus in pregnancy, unspecified control: O24.419

## 2015-07-22 HISTORY — DX: Essential (primary) hypertension: I10

## 2015-07-22 HISTORY — DX: Gastro-esophageal reflux disease with esophagitis, without bleeding: K21.00

## 2015-07-22 HISTORY — DX: Hypokalemia: E87.6

## 2015-08-13 DIAGNOSIS — R609 Edema, unspecified: Secondary | ICD-10-CM | POA: Diagnosis not present

## 2015-08-13 DIAGNOSIS — M314 Aortic arch syndrome [Takayasu]: Secondary | ICD-10-CM | POA: Diagnosis not present

## 2015-08-13 DIAGNOSIS — I10 Essential (primary) hypertension: Secondary | ICD-10-CM | POA: Diagnosis not present

## 2015-09-26 DIAGNOSIS — R300 Dysuria: Secondary | ICD-10-CM | POA: Diagnosis not present

## 2015-09-26 DIAGNOSIS — R42 Dizziness and giddiness: Secondary | ICD-10-CM | POA: Diagnosis not present

## 2015-09-26 DIAGNOSIS — M546 Pain in thoracic spine: Secondary | ICD-10-CM | POA: Diagnosis not present

## 2015-09-26 DIAGNOSIS — R5383 Other fatigue: Secondary | ICD-10-CM | POA: Diagnosis not present

## 2015-10-19 DIAGNOSIS — Z79899 Other long term (current) drug therapy: Secondary | ICD-10-CM

## 2015-10-19 DIAGNOSIS — R12 Heartburn: Secondary | ICD-10-CM

## 2015-10-19 HISTORY — DX: Heartburn: R12

## 2015-10-19 HISTORY — DX: Other long term (current) drug therapy: Z79.899

## 2016-03-18 DIAGNOSIS — I517 Cardiomegaly: Secondary | ICD-10-CM | POA: Insufficient documentation

## 2016-03-18 HISTORY — DX: Cardiomegaly: I51.7

## 2016-03-21 DIAGNOSIS — M2550 Pain in unspecified joint: Secondary | ICD-10-CM | POA: Diagnosis not present

## 2016-03-21 DIAGNOSIS — H538 Other visual disturbances: Secondary | ICD-10-CM | POA: Diagnosis not present

## 2016-03-21 DIAGNOSIS — R739 Hyperglycemia, unspecified: Secondary | ICD-10-CM | POA: Diagnosis not present

## 2016-03-21 DIAGNOSIS — R0602 Shortness of breath: Secondary | ICD-10-CM | POA: Diagnosis not present

## 2016-03-21 DIAGNOSIS — Z79899 Other long term (current) drug therapy: Secondary | ICD-10-CM | POA: Diagnosis not present

## 2016-03-21 DIAGNOSIS — I10 Essential (primary) hypertension: Secondary | ICD-10-CM | POA: Diagnosis not present

## 2016-03-21 DIAGNOSIS — M314 Aortic arch syndrome [Takayasu]: Secondary | ICD-10-CM | POA: Diagnosis not present

## 2016-03-21 DIAGNOSIS — I456 Pre-excitation syndrome: Secondary | ICD-10-CM | POA: Diagnosis not present

## 2016-03-28 DIAGNOSIS — R0600 Dyspnea, unspecified: Secondary | ICD-10-CM | POA: Diagnosis not present

## 2016-04-04 DIAGNOSIS — H5203 Hypermetropia, bilateral: Secondary | ICD-10-CM | POA: Diagnosis not present

## 2016-04-04 DIAGNOSIS — H52203 Unspecified astigmatism, bilateral: Secondary | ICD-10-CM | POA: Diagnosis not present

## 2016-04-04 DIAGNOSIS — M314 Aortic arch syndrome [Takayasu]: Secondary | ICD-10-CM | POA: Diagnosis not present

## 2016-04-04 DIAGNOSIS — H16223 Keratoconjunctivitis sicca, not specified as Sjogren's, bilateral: Secondary | ICD-10-CM | POA: Diagnosis not present

## 2016-04-04 DIAGNOSIS — H524 Presbyopia: Secondary | ICD-10-CM | POA: Diagnosis not present

## 2016-04-04 DIAGNOSIS — H43393 Other vitreous opacities, bilateral: Secondary | ICD-10-CM | POA: Diagnosis not present

## 2016-04-04 DIAGNOSIS — Z7952 Long term (current) use of systemic steroids: Secondary | ICD-10-CM | POA: Diagnosis not present

## 2016-05-23 DIAGNOSIS — G8929 Other chronic pain: Secondary | ICD-10-CM

## 2016-05-23 DIAGNOSIS — B181 Chronic viral hepatitis B without delta-agent: Secondary | ICD-10-CM | POA: Diagnosis not present

## 2016-05-23 DIAGNOSIS — Z79899 Other long term (current) drug therapy: Secondary | ICD-10-CM | POA: Diagnosis not present

## 2016-05-23 DIAGNOSIS — M314 Aortic arch syndrome [Takayasu]: Secondary | ICD-10-CM | POA: Diagnosis not present

## 2016-05-23 DIAGNOSIS — M546 Pain in thoracic spine: Secondary | ICD-10-CM | POA: Diagnosis not present

## 2016-05-23 HISTORY — DX: Other chronic pain: G89.29

## 2016-06-01 DIAGNOSIS — R0600 Dyspnea, unspecified: Secondary | ICD-10-CM | POA: Diagnosis not present

## 2016-08-11 DIAGNOSIS — M545 Low back pain: Secondary | ICD-10-CM | POA: Diagnosis not present

## 2016-08-11 DIAGNOSIS — R7303 Prediabetes: Secondary | ICD-10-CM | POA: Diagnosis not present

## 2016-08-11 DIAGNOSIS — Z79899 Other long term (current) drug therapy: Secondary | ICD-10-CM | POA: Diagnosis not present

## 2016-08-11 DIAGNOSIS — I10 Essential (primary) hypertension: Secondary | ICD-10-CM | POA: Diagnosis not present

## 2016-08-22 DIAGNOSIS — Z79899 Other long term (current) drug therapy: Secondary | ICD-10-CM | POA: Diagnosis not present

## 2016-08-22 DIAGNOSIS — M546 Pain in thoracic spine: Secondary | ICD-10-CM | POA: Diagnosis not present

## 2016-08-22 DIAGNOSIS — M314 Aortic arch syndrome [Takayasu]: Secondary | ICD-10-CM | POA: Diagnosis not present

## 2016-08-22 DIAGNOSIS — G8929 Other chronic pain: Secondary | ICD-10-CM | POA: Diagnosis not present

## 2016-11-03 DIAGNOSIS — Z780 Asymptomatic menopausal state: Secondary | ICD-10-CM | POA: Diagnosis not present

## 2016-11-03 DIAGNOSIS — Z7952 Long term (current) use of systemic steroids: Secondary | ICD-10-CM | POA: Diagnosis not present

## 2016-11-15 DIAGNOSIS — E785 Hyperlipidemia, unspecified: Secondary | ICD-10-CM

## 2016-11-15 DIAGNOSIS — E119 Type 2 diabetes mellitus without complications: Secondary | ICD-10-CM

## 2016-11-15 DIAGNOSIS — R079 Chest pain, unspecified: Secondary | ICD-10-CM

## 2016-11-15 DIAGNOSIS — E782 Mixed hyperlipidemia: Secondary | ICD-10-CM | POA: Insufficient documentation

## 2016-11-15 HISTORY — DX: Hyperlipidemia, unspecified: E78.5

## 2016-11-15 HISTORY — DX: Mixed hyperlipidemia: E78.2

## 2016-11-15 HISTORY — DX: Type 2 diabetes mellitus without complications: E11.9

## 2016-11-15 HISTORY — DX: Chest pain, unspecified: R07.9

## 2016-11-16 DIAGNOSIS — R079 Chest pain, unspecified: Secondary | ICD-10-CM | POA: Diagnosis not present

## 2016-11-17 DIAGNOSIS — J45909 Unspecified asthma, uncomplicated: Secondary | ICD-10-CM | POA: Diagnosis not present

## 2016-11-17 DIAGNOSIS — I10 Essential (primary) hypertension: Secondary | ICD-10-CM | POA: Diagnosis not present

## 2016-11-17 DIAGNOSIS — L219 Seborrheic dermatitis, unspecified: Secondary | ICD-10-CM | POA: Diagnosis not present

## 2016-11-17 DIAGNOSIS — F411 Generalized anxiety disorder: Secondary | ICD-10-CM | POA: Diagnosis not present

## 2016-11-22 DIAGNOSIS — M314 Aortic arch syndrome [Takayasu]: Secondary | ICD-10-CM | POA: Diagnosis not present

## 2016-11-22 DIAGNOSIS — Z79899 Other long term (current) drug therapy: Secondary | ICD-10-CM | POA: Diagnosis not present

## 2016-12-21 DIAGNOSIS — Z79899 Other long term (current) drug therapy: Secondary | ICD-10-CM | POA: Diagnosis not present

## 2016-12-21 DIAGNOSIS — M314 Aortic arch syndrome [Takayasu]: Secondary | ICD-10-CM | POA: Diagnosis not present

## 2017-01-03 DIAGNOSIS — M314 Aortic arch syndrome [Takayasu]: Secondary | ICD-10-CM | POA: Diagnosis not present

## 2017-01-03 DIAGNOSIS — R1310 Dysphagia, unspecified: Secondary | ICD-10-CM | POA: Diagnosis not present

## 2017-01-03 DIAGNOSIS — E7800 Pure hypercholesterolemia, unspecified: Secondary | ICD-10-CM | POA: Diagnosis not present

## 2017-01-30 DIAGNOSIS — Z8541 Personal history of malignant neoplasm of cervix uteri: Secondary | ICD-10-CM | POA: Diagnosis not present

## 2017-01-30 DIAGNOSIS — R5383 Other fatigue: Secondary | ICD-10-CM | POA: Diagnosis not present

## 2017-01-30 DIAGNOSIS — E559 Vitamin D deficiency, unspecified: Secondary | ICD-10-CM | POA: Diagnosis not present

## 2017-01-30 DIAGNOSIS — R7303 Prediabetes: Secondary | ICD-10-CM | POA: Diagnosis not present

## 2017-01-30 DIAGNOSIS — E0590 Thyrotoxicosis, unspecified without thyrotoxic crisis or storm: Secondary | ICD-10-CM | POA: Diagnosis not present

## 2017-01-30 DIAGNOSIS — Z23 Encounter for immunization: Secondary | ICD-10-CM | POA: Diagnosis not present

## 2017-01-30 DIAGNOSIS — K649 Unspecified hemorrhoids: Secondary | ICD-10-CM | POA: Diagnosis not present

## 2017-01-30 DIAGNOSIS — Z79899 Other long term (current) drug therapy: Secondary | ICD-10-CM | POA: Diagnosis not present

## 2017-01-30 DIAGNOSIS — O99810 Abnormal glucose complicating pregnancy: Secondary | ICD-10-CM | POA: Diagnosis not present

## 2017-01-30 DIAGNOSIS — I10 Essential (primary) hypertension: Secondary | ICD-10-CM | POA: Diagnosis not present

## 2017-02-15 DIAGNOSIS — K6289 Other specified diseases of anus and rectum: Secondary | ICD-10-CM | POA: Diagnosis not present

## 2017-02-21 DIAGNOSIS — K625 Hemorrhage of anus and rectum: Secondary | ICD-10-CM | POA: Diagnosis not present

## 2017-02-21 DIAGNOSIS — K648 Other hemorrhoids: Secondary | ICD-10-CM | POA: Diagnosis not present

## 2017-02-21 DIAGNOSIS — K6289 Other specified diseases of anus and rectum: Secondary | ICD-10-CM | POA: Diagnosis not present

## 2017-02-21 DIAGNOSIS — Z01818 Encounter for other preprocedural examination: Secondary | ICD-10-CM | POA: Diagnosis not present

## 2017-03-14 DIAGNOSIS — K635 Polyp of colon: Secondary | ICD-10-CM | POA: Diagnosis not present

## 2017-04-20 DIAGNOSIS — K625 Hemorrhage of anus and rectum: Secondary | ICD-10-CM | POA: Diagnosis not present

## 2017-04-20 DIAGNOSIS — K6289 Other specified diseases of anus and rectum: Secondary | ICD-10-CM | POA: Diagnosis not present

## 2017-05-04 DIAGNOSIS — E0590 Thyrotoxicosis, unspecified without thyrotoxic crisis or storm: Secondary | ICD-10-CM

## 2017-05-04 HISTORY — DX: Thyrotoxicosis, unspecified without thyrotoxic crisis or storm: E05.90

## 2017-05-09 DIAGNOSIS — K635 Polyp of colon: Secondary | ICD-10-CM | POA: Diagnosis not present

## 2017-05-09 DIAGNOSIS — K648 Other hemorrhoids: Secondary | ICD-10-CM | POA: Diagnosis not present

## 2017-05-09 DIAGNOSIS — Z01818 Encounter for other preprocedural examination: Secondary | ICD-10-CM | POA: Diagnosis not present

## 2017-05-19 DIAGNOSIS — K635 Polyp of colon: Secondary | ICD-10-CM | POA: Diagnosis not present

## 2017-05-31 DIAGNOSIS — K635 Polyp of colon: Secondary | ICD-10-CM | POA: Diagnosis not present

## 2017-05-31 DIAGNOSIS — K6289 Other specified diseases of anus and rectum: Secondary | ICD-10-CM | POA: Diagnosis not present

## 2017-07-25 DIAGNOSIS — E0590 Thyrotoxicosis, unspecified without thyrotoxic crisis or storm: Secondary | ICD-10-CM | POA: Diagnosis not present

## 2017-07-28 DIAGNOSIS — K648 Other hemorrhoids: Secondary | ICD-10-CM | POA: Diagnosis not present

## 2017-07-28 DIAGNOSIS — K6289 Other specified diseases of anus and rectum: Secondary | ICD-10-CM | POA: Diagnosis not present

## 2017-07-28 DIAGNOSIS — K625 Hemorrhage of anus and rectum: Secondary | ICD-10-CM | POA: Diagnosis not present

## 2017-07-28 DIAGNOSIS — Z01818 Encounter for other preprocedural examination: Secondary | ICD-10-CM | POA: Diagnosis not present

## 2017-08-16 DIAGNOSIS — K6289 Other specified diseases of anus and rectum: Secondary | ICD-10-CM | POA: Diagnosis not present

## 2017-09-18 DIAGNOSIS — Z79899 Other long term (current) drug therapy: Secondary | ICD-10-CM | POA: Diagnosis not present

## 2017-09-18 DIAGNOSIS — M314 Aortic arch syndrome [Takayasu]: Secondary | ICD-10-CM | POA: Diagnosis not present

## 2017-09-18 DIAGNOSIS — B181 Chronic viral hepatitis B without delta-agent: Secondary | ICD-10-CM | POA: Diagnosis not present

## 2017-10-13 DIAGNOSIS — H5203 Hypermetropia, bilateral: Secondary | ICD-10-CM | POA: Insufficient documentation

## 2017-10-13 DIAGNOSIS — H43393 Other vitreous opacities, bilateral: Secondary | ICD-10-CM

## 2017-10-13 DIAGNOSIS — H04123 Dry eye syndrome of bilateral lacrimal glands: Secondary | ICD-10-CM | POA: Diagnosis not present

## 2017-10-13 DIAGNOSIS — H16223 Keratoconjunctivitis sicca, not specified as Sjogren's, bilateral: Secondary | ICD-10-CM

## 2017-10-13 DIAGNOSIS — M314 Aortic arch syndrome [Takayasu]: Secondary | ICD-10-CM | POA: Diagnosis not present

## 2017-10-13 HISTORY — DX: Hypermetropia, bilateral: H52.03

## 2017-10-13 HISTORY — DX: Keratoconjunctivitis sicca, not specified as Sjogren's, bilateral: H16.223

## 2017-10-13 HISTORY — DX: Other vitreous opacities, bilateral: H43.393

## 2017-11-27 DIAGNOSIS — E0590 Thyrotoxicosis, unspecified without thyrotoxic crisis or storm: Secondary | ICD-10-CM | POA: Diagnosis not present

## 2017-12-04 DIAGNOSIS — E559 Vitamin D deficiency, unspecified: Secondary | ICD-10-CM | POA: Diagnosis not present

## 2017-12-04 DIAGNOSIS — E7800 Pure hypercholesterolemia, unspecified: Secondary | ICD-10-CM | POA: Diagnosis not present

## 2017-12-04 DIAGNOSIS — R42 Dizziness and giddiness: Secondary | ICD-10-CM | POA: Diagnosis not present

## 2017-12-04 DIAGNOSIS — M314 Aortic arch syndrome [Takayasu]: Secondary | ICD-10-CM | POA: Diagnosis not present

## 2017-12-04 DIAGNOSIS — R7303 Prediabetes: Secondary | ICD-10-CM | POA: Diagnosis not present

## 2017-12-04 DIAGNOSIS — I10 Essential (primary) hypertension: Secondary | ICD-10-CM | POA: Diagnosis not present

## 2017-12-14 DIAGNOSIS — R9389 Abnormal findings on diagnostic imaging of other specified body structures: Secondary | ICD-10-CM | POA: Diagnosis not present

## 2017-12-14 DIAGNOSIS — E0590 Thyrotoxicosis, unspecified without thyrotoxic crisis or storm: Secondary | ICD-10-CM | POA: Diagnosis not present

## 2018-02-26 DIAGNOSIS — M314 Aortic arch syndrome [Takayasu]: Secondary | ICD-10-CM | POA: Diagnosis not present

## 2018-02-26 DIAGNOSIS — E119 Type 2 diabetes mellitus without complications: Secondary | ICD-10-CM | POA: Diagnosis not present

## 2018-02-26 DIAGNOSIS — I10 Essential (primary) hypertension: Secondary | ICD-10-CM | POA: Diagnosis not present

## 2018-02-26 DIAGNOSIS — Z23 Encounter for immunization: Secondary | ICD-10-CM | POA: Diagnosis not present

## 2018-03-07 DIAGNOSIS — I517 Cardiomegaly: Secondary | ICD-10-CM | POA: Diagnosis not present

## 2018-03-07 DIAGNOSIS — R002 Palpitations: Secondary | ICD-10-CM | POA: Diagnosis not present

## 2018-03-07 DIAGNOSIS — R0600 Dyspnea, unspecified: Secondary | ICD-10-CM | POA: Diagnosis not present

## 2018-03-07 DIAGNOSIS — R202 Paresthesia of skin: Secondary | ICD-10-CM | POA: Diagnosis not present

## 2018-03-07 DIAGNOSIS — R0602 Shortness of breath: Secondary | ICD-10-CM | POA: Diagnosis not present

## 2018-03-07 DIAGNOSIS — R9431 Abnormal electrocardiogram [ECG] [EKG]: Secondary | ICD-10-CM | POA: Diagnosis not present

## 2018-03-07 DIAGNOSIS — F410 Panic disorder [episodic paroxysmal anxiety] without agoraphobia: Secondary | ICD-10-CM | POA: Diagnosis not present

## 2018-03-08 DIAGNOSIS — I517 Cardiomegaly: Secondary | ICD-10-CM | POA: Diagnosis not present

## 2018-03-08 DIAGNOSIS — R9431 Abnormal electrocardiogram [ECG] [EKG]: Secondary | ICD-10-CM | POA: Diagnosis not present

## 2018-03-20 DIAGNOSIS — R0981 Nasal congestion: Secondary | ICD-10-CM | POA: Diagnosis not present

## 2018-03-20 DIAGNOSIS — M314 Aortic arch syndrome [Takayasu]: Secondary | ICD-10-CM | POA: Diagnosis not present

## 2018-03-20 DIAGNOSIS — R0989 Other specified symptoms and signs involving the circulatory and respiratory systems: Secondary | ICD-10-CM | POA: Diagnosis not present

## 2018-03-20 DIAGNOSIS — F419 Anxiety disorder, unspecified: Secondary | ICD-10-CM | POA: Diagnosis not present

## 2018-03-20 DIAGNOSIS — R0982 Postnasal drip: Secondary | ICD-10-CM | POA: Diagnosis not present

## 2018-03-20 DIAGNOSIS — B181 Chronic viral hepatitis B without delta-agent: Secondary | ICD-10-CM | POA: Diagnosis not present

## 2018-03-29 DIAGNOSIS — E0590 Thyrotoxicosis, unspecified without thyrotoxic crisis or storm: Secondary | ICD-10-CM | POA: Diagnosis not present

## 2018-05-29 DIAGNOSIS — Z794 Long term (current) use of insulin: Secondary | ICD-10-CM | POA: Diagnosis not present

## 2018-05-29 DIAGNOSIS — E119 Type 2 diabetes mellitus without complications: Secondary | ICD-10-CM | POA: Diagnosis not present

## 2018-06-01 DIAGNOSIS — R509 Fever, unspecified: Secondary | ICD-10-CM | POA: Diagnosis not present

## 2018-06-01 DIAGNOSIS — J029 Acute pharyngitis, unspecified: Secondary | ICD-10-CM | POA: Diagnosis not present

## 2018-06-01 DIAGNOSIS — J018 Other acute sinusitis: Secondary | ICD-10-CM | POA: Diagnosis not present

## 2018-06-07 ENCOUNTER — Other Ambulatory Visit: Payer: Self-pay | Admitting: Family Medicine

## 2018-06-07 ENCOUNTER — Other Ambulatory Visit (HOSPITAL_COMMUNITY)
Admission: RE | Admit: 2018-06-07 | Discharge: 2018-06-07 | Disposition: A | Discharge status: Home / Self Care | Payer: BLUE CROSS/BLUE SHIELD | Source: Ambulatory Visit | Attending: Family Medicine | Admitting: Family Medicine

## 2018-06-07 DIAGNOSIS — E559 Vitamin D deficiency, unspecified: Secondary | ICD-10-CM | POA: Diagnosis not present

## 2018-06-07 DIAGNOSIS — E119 Type 2 diabetes mellitus without complications: Secondary | ICD-10-CM | POA: Diagnosis not present

## 2018-06-07 DIAGNOSIS — Z1151 Encounter for screening for human papillomavirus (HPV): Secondary | ICD-10-CM | POA: Diagnosis not present

## 2018-06-07 DIAGNOSIS — Z79899 Other long term (current) drug therapy: Secondary | ICD-10-CM | POA: Diagnosis not present

## 2018-06-07 DIAGNOSIS — I10 Essential (primary) hypertension: Secondary | ICD-10-CM | POA: Diagnosis not present

## 2018-06-07 DIAGNOSIS — Z01411 Encounter for gynecological examination (general) (routine) with abnormal findings: Secondary | ICD-10-CM | POA: Insufficient documentation

## 2018-06-07 DIAGNOSIS — Z0000 Encounter for general adult medical examination without abnormal findings: Secondary | ICD-10-CM | POA: Diagnosis not present

## 2018-06-07 DIAGNOSIS — E0590 Thyrotoxicosis, unspecified without thyrotoxic crisis or storm: Secondary | ICD-10-CM | POA: Diagnosis not present

## 2018-06-12 LAB — CYTOLOGY - PAP
Diagnosis: UNDETERMINED — AB
HPV: NOT DETECTED

## 2018-06-20 ENCOUNTER — Telehealth: Payer: Self-pay | Admitting: *Deleted

## 2018-06-20 NOTE — Telephone Encounter (Signed)
Called and scheduled the patient to see Dr. Denman George next Friday 06/29/2018 at 12pm. Just the patient to arrive between 11:30 and 11:45am. Gave the patient information regarding free valet and that we will perform a pelvic exam that day. Patient given address.

## 2018-06-25 ENCOUNTER — Telehealth: Payer: Self-pay | Admitting: *Deleted

## 2018-06-25 NOTE — Telephone Encounter (Signed)
Patient called and rescheduled her appt from this Friday to next month. Patient stated "I want to reschedule my appt, I'm just afraid to go out in crowds right now." I have moved the appt to 4/15 at 10:45am

## 2018-06-29 ENCOUNTER — Ambulatory Visit: Payer: Medicare Other | Admitting: Gynecologic Oncology

## 2018-07-24 ENCOUNTER — Telehealth: Payer: Self-pay | Admitting: *Deleted

## 2018-07-24 DIAGNOSIS — Z794 Long term (current) use of insulin: Secondary | ICD-10-CM | POA: Diagnosis not present

## 2018-07-24 DIAGNOSIS — E119 Type 2 diabetes mellitus without complications: Secondary | ICD-10-CM | POA: Diagnosis not present

## 2018-07-24 NOTE — Telephone Encounter (Signed)
Called to speak the patient regarding her appt on 4/15. Patient has no computer or email address. Per patient request, we will reschedule her appt to July.

## 2018-08-01 ENCOUNTER — Ambulatory Visit: Payer: Medicare Other | Admitting: Gynecologic Oncology

## 2018-09-17 ENCOUNTER — Telehealth: Payer: Self-pay | Admitting: *Deleted

## 2018-09-17 NOTE — Telephone Encounter (Signed)
Called and left the patient a message to call the office back. Need to schedule a new patient appt  

## 2018-09-18 ENCOUNTER — Telehealth: Payer: Self-pay | Admitting: *Deleted

## 2018-09-18 NOTE — Telephone Encounter (Signed)
Called and scheduled the patient for a new patient appt

## 2018-09-20 DIAGNOSIS — Z79899 Other long term (current) drug therapy: Secondary | ICD-10-CM | POA: Diagnosis not present

## 2018-09-20 DIAGNOSIS — B181 Chronic viral hepatitis B without delta-agent: Secondary | ICD-10-CM | POA: Diagnosis not present

## 2018-09-20 DIAGNOSIS — M314 Aortic arch syndrome [Takayasu]: Secondary | ICD-10-CM | POA: Diagnosis not present

## 2018-10-01 DIAGNOSIS — E119 Type 2 diabetes mellitus without complications: Secondary | ICD-10-CM | POA: Diagnosis not present

## 2018-10-01 DIAGNOSIS — E0590 Thyrotoxicosis, unspecified without thyrotoxic crisis or storm: Secondary | ICD-10-CM | POA: Diagnosis not present

## 2018-10-10 DIAGNOSIS — Z794 Long term (current) use of insulin: Secondary | ICD-10-CM | POA: Diagnosis not present

## 2018-10-10 DIAGNOSIS — E119 Type 2 diabetes mellitus without complications: Secondary | ICD-10-CM | POA: Diagnosis not present

## 2018-10-17 ENCOUNTER — Ambulatory Visit: Payer: Medicare Other | Admitting: Gynecologic Oncology

## 2018-11-13 DIAGNOSIS — E119 Type 2 diabetes mellitus without complications: Secondary | ICD-10-CM | POA: Diagnosis not present

## 2019-01-25 DIAGNOSIS — I10 Essential (primary) hypertension: Secondary | ICD-10-CM | POA: Diagnosis not present

## 2019-01-25 DIAGNOSIS — E0590 Thyrotoxicosis, unspecified without thyrotoxic crisis or storm: Secondary | ICD-10-CM | POA: Diagnosis not present

## 2019-01-25 DIAGNOSIS — E119 Type 2 diabetes mellitus without complications: Secondary | ICD-10-CM | POA: Diagnosis not present

## 2019-05-21 DIAGNOSIS — K5900 Constipation, unspecified: Secondary | ICD-10-CM | POA: Diagnosis not present

## 2019-05-21 DIAGNOSIS — K648 Other hemorrhoids: Secondary | ICD-10-CM | POA: Diagnosis not present

## 2019-06-13 DIAGNOSIS — H04123 Dry eye syndrome of bilateral lacrimal glands: Secondary | ICD-10-CM | POA: Diagnosis not present

## 2019-07-08 ENCOUNTER — Other Ambulatory Visit (HOSPITAL_COMMUNITY)
Admission: RE | Admit: 2019-07-08 | Discharge: 2019-07-08 | Disposition: A | Discharge status: Home / Self Care | Payer: BC Managed Care – PPO | Source: Ambulatory Visit | Attending: Family Medicine | Admitting: Family Medicine

## 2019-07-08 ENCOUNTER — Other Ambulatory Visit: Payer: Self-pay | Admitting: Family Medicine

## 2019-07-08 DIAGNOSIS — I10 Essential (primary) hypertension: Secondary | ICD-10-CM | POA: Diagnosis not present

## 2019-07-08 DIAGNOSIS — Z1151 Encounter for screening for human papillomavirus (HPV): Secondary | ICD-10-CM | POA: Insufficient documentation

## 2019-07-08 DIAGNOSIS — Z8541 Personal history of malignant neoplasm of cervix uteri: Secondary | ICD-10-CM | POA: Diagnosis not present

## 2019-07-08 DIAGNOSIS — E119 Type 2 diabetes mellitus without complications: Secondary | ICD-10-CM | POA: Diagnosis not present

## 2019-07-08 DIAGNOSIS — M314 Aortic arch syndrome [Takayasu]: Secondary | ICD-10-CM | POA: Diagnosis not present

## 2019-07-08 DIAGNOSIS — K2100 Gastro-esophageal reflux disease with esophagitis, without bleeding: Secondary | ICD-10-CM | POA: Diagnosis not present

## 2019-07-08 DIAGNOSIS — E0590 Thyrotoxicosis, unspecified without thyrotoxic crisis or storm: Secondary | ICD-10-CM | POA: Diagnosis not present

## 2019-07-08 DIAGNOSIS — Z0000 Encounter for general adult medical examination without abnormal findings: Secondary | ICD-10-CM | POA: Diagnosis not present

## 2019-07-08 DIAGNOSIS — R87611 Atypical squamous cells cannot exclude high grade squamous intraepithelial lesion on cytologic smear of cervix (ASC-H): Secondary | ICD-10-CM | POA: Diagnosis not present

## 2019-07-08 DIAGNOSIS — Z79899 Other long term (current) drug therapy: Secondary | ICD-10-CM | POA: Diagnosis not present

## 2019-07-08 DIAGNOSIS — E559 Vitamin D deficiency, unspecified: Secondary | ICD-10-CM | POA: Diagnosis not present

## 2019-07-08 DIAGNOSIS — Z01411 Encounter for gynecological examination (general) (routine) with abnormal findings: Secondary | ICD-10-CM | POA: Diagnosis not present

## 2019-07-12 LAB — CYTOLOGY - PAP
Comment: NEGATIVE
Diagnosis: HIGH — AB
High risk HPV: NEGATIVE

## 2019-08-20 DIAGNOSIS — R198 Other specified symptoms and signs involving the digestive system and abdomen: Secondary | ICD-10-CM

## 2019-08-20 HISTORY — DX: Other specified symptoms and signs involving the digestive system and abdomen: R19.8

## 2019-08-26 DIAGNOSIS — E0590 Thyrotoxicosis, unspecified without thyrotoxic crisis or storm: Secondary | ICD-10-CM | POA: Diagnosis not present

## 2019-08-26 DIAGNOSIS — I10 Essential (primary) hypertension: Secondary | ICD-10-CM | POA: Diagnosis not present

## 2019-08-26 DIAGNOSIS — E119 Type 2 diabetes mellitus without complications: Secondary | ICD-10-CM | POA: Diagnosis not present

## 2019-08-30 ENCOUNTER — Other Ambulatory Visit: Payer: Self-pay

## 2019-08-30 ENCOUNTER — Encounter: Payer: Self-pay | Admitting: Obstetrics & Gynecology

## 2019-08-30 ENCOUNTER — Ambulatory Visit (INDEPENDENT_AMBULATORY_CARE_PROVIDER_SITE_OTHER): Payer: BC Managed Care – PPO | Admitting: Obstetrics & Gynecology

## 2019-08-30 ENCOUNTER — Other Ambulatory Visit (HOSPITAL_COMMUNITY)
Admission: RE | Admit: 2019-08-30 | Discharge: 2019-08-30 | Disposition: A | Discharge status: Home / Self Care | Payer: BC Managed Care – PPO | Source: Ambulatory Visit | Attending: Obstetrics & Gynecology | Admitting: Obstetrics & Gynecology

## 2019-08-30 VITALS — BP 170/100 | HR 75 | Ht 61.0 in | Wt 186.1 lb

## 2019-08-30 DIAGNOSIS — R87610 Atypical squamous cells of undetermined significance on cytologic smear of cervix (ASC-US): Secondary | ICD-10-CM | POA: Diagnosis not present

## 2019-08-30 DIAGNOSIS — N870 Mild cervical dysplasia: Secondary | ICD-10-CM

## 2019-08-30 DIAGNOSIS — Z1231 Encounter for screening mammogram for malignant neoplasm of breast: Secondary | ICD-10-CM

## 2019-08-30 NOTE — Patient Instructions (Signed)

## 2019-08-30 NOTE — Progress Notes (Signed)
Pt has a h/o cervical cancer in 2021. She was treated at [facility] with chemo and radiation. She had a PAP done 07/08/2019 which resulted in Negative    Adequacy Satisfactory for evaluation; transformation zone component PRESENT.   Diagnosis - Atypical squamous cells, cannot exclude high grade squamousAbnormal    Diagnosis intraepithelial lesion (ASC-H)Abnormal    Diagnosis - See commentAbnormal    Comment Dr. Saralyn Pilar reviewed the case and concurs with the diagnosis.   Comment Normal Reference Range HPV - Negative    Pt is here for a colposcopy.    Patient given informed consent, signed copy in the chart, time out was performed.  Placed in lithotomy position. Cervix viewed with speculum and colposcope after application of acetic acid.   Colposcopy adequate?  Yes for a post radiation cervix. The TZ is obliterated but, I was able to open the cervix some  Acetowhite lesions? Yes from 11-1 o'clock Punctation? no Mosaicism?  no Abnormal vasculature?  No. The cervix looks a bit more vascular,  In general, than usual however, this is not near the os per se  Biopsies? yes ECC? yes   Patient was given post procedure instructions.  Pt would like the results via telephone if possible. I have asked her to make an appt just in case. If it is appropriate we can get her results via telephone. She will return in 2 weeks for results. Annual PAPs if the bx is WNL, Screening mammogram.    Hoyle Sauer L. Harraway-Smith, M.D., Cherlynn June

## 2019-09-03 LAB — SURGICAL PATHOLOGY

## 2019-09-09 ENCOUNTER — Ambulatory Visit: Payer: BC Managed Care – PPO | Admitting: Obstetrics & Gynecology

## 2019-09-09 ENCOUNTER — Ambulatory Visit (HOSPITAL_BASED_OUTPATIENT_CLINIC_OR_DEPARTMENT_OTHER): Payer: BC Managed Care – PPO

## 2019-09-18 DIAGNOSIS — E0590 Thyrotoxicosis, unspecified without thyrotoxic crisis or storm: Secondary | ICD-10-CM | POA: Diagnosis not present

## 2019-09-18 DIAGNOSIS — I10 Essential (primary) hypertension: Secondary | ICD-10-CM | POA: Diagnosis not present

## 2019-09-18 DIAGNOSIS — E119 Type 2 diabetes mellitus without complications: Secondary | ICD-10-CM | POA: Diagnosis not present

## 2019-09-20 DIAGNOSIS — B181 Chronic viral hepatitis B without delta-agent: Secondary | ICD-10-CM | POA: Diagnosis not present

## 2019-09-20 DIAGNOSIS — I10 Essential (primary) hypertension: Secondary | ICD-10-CM | POA: Diagnosis not present

## 2019-09-20 DIAGNOSIS — M314 Aortic arch syndrome [Takayasu]: Secondary | ICD-10-CM | POA: Diagnosis not present

## 2019-09-23 ENCOUNTER — Ambulatory Visit (INDEPENDENT_AMBULATORY_CARE_PROVIDER_SITE_OTHER): Payer: BC Managed Care – PPO | Admitting: Obstetrics & Gynecology

## 2019-09-23 ENCOUNTER — Encounter: Payer: Self-pay | Admitting: Obstetrics & Gynecology

## 2019-09-23 ENCOUNTER — Ambulatory Visit (HOSPITAL_BASED_OUTPATIENT_CLINIC_OR_DEPARTMENT_OTHER)
Admission: RE | Admit: 2019-09-23 | Discharge: 2019-09-23 | Disposition: A | Discharge status: Home / Self Care | Payer: BC Managed Care – PPO | Source: Ambulatory Visit | Attending: Obstetrics & Gynecology | Admitting: Obstetrics & Gynecology

## 2019-09-23 ENCOUNTER — Other Ambulatory Visit: Payer: Self-pay

## 2019-09-23 ENCOUNTER — Encounter (HOSPITAL_BASED_OUTPATIENT_CLINIC_OR_DEPARTMENT_OTHER): Payer: Self-pay

## 2019-09-23 VITALS — BP 136/61 | HR 73 | Ht 61.0 in | Wt 185.0 lb

## 2019-09-23 DIAGNOSIS — Z1231 Encounter for screening mammogram for malignant neoplasm of breast: Secondary | ICD-10-CM

## 2019-09-23 DIAGNOSIS — N870 Mild cervical dysplasia: Secondary | ICD-10-CM

## 2019-09-23 NOTE — Progress Notes (Signed)
History:  49 y.o. G1P1001 here today for f/u of colpo results.. Pt with no new problems. She reports feeling concerned about her results. She felt that the exam was uncomfortable. Pt has a history of cervical cancer treated at [facility] with chemo and radiation.  The following portions of the patient's history were reviewed and updated as appropriate: allergies, current medications, past family history, past medical history, past social history, past surgical history and problem list.  Review of Systems:  Pertinent items are noted in HPI.    Objective:  Physical Exam Blood pressure 136/61, pulse 73, height 5\' 1"  (1.549 m), weight 185 lb (83.9 kg).  CONSTITUTIONAL: Well-developed, well-nourished female in no acute distress.  HENT:  Normocephalic, atraumatic EYES: Conjunctivae and EOM are normal. No scleral icterus.  NECK: Normal range of motion SKIN: Skin is warm and dry. No rash noted. Not diaphoretic.No pallor. [place]: Alert and oriented to person, place, and time. Normal coordination.    Labs and Imaging 08/30/2019 ENDOCERVICAL, CURETTAGE:  - Low-grade squamous intraepithelial lesion, CIN-1 (mild dysplasia).  - No endocervical tissue identified.  - See comment.   B. CERVIX, BIOPSY:  - Low-grade squamous intraepithelial lesion, CIN-1 (mild dysplasia).  - See comment   Assessment & Plan:  ASCUS PAP with bx showing LGSIL.  Reviewed surg path with pt.   Rec close f/u given her history. Recommend repeat PAP in 6 months.   Pt requests 1 year. She agreed to come in by 8 months.  I expressed to her my concern.   Total face-to-face time with patient was 20 min.  Greater than 50% was spent in counseling and coordination of care with the patient.    L. Harraway-Smith, M.D., Cherlynn June

## 2019-09-25 DIAGNOSIS — E0590 Thyrotoxicosis, unspecified without thyrotoxic crisis or storm: Secondary | ICD-10-CM | POA: Diagnosis not present

## 2020-01-03 DIAGNOSIS — E0590 Thyrotoxicosis, unspecified without thyrotoxic crisis or storm: Secondary | ICD-10-CM | POA: Diagnosis not present

## 2020-01-03 DIAGNOSIS — E119 Type 2 diabetes mellitus without complications: Secondary | ICD-10-CM | POA: Diagnosis not present

## 2020-01-23 DIAGNOSIS — K625 Hemorrhage of anus and rectum: Secondary | ICD-10-CM | POA: Diagnosis not present

## 2020-01-23 DIAGNOSIS — K5900 Constipation, unspecified: Secondary | ICD-10-CM | POA: Diagnosis not present

## 2020-01-23 DIAGNOSIS — B181 Chronic viral hepatitis B without delta-agent: Secondary | ICD-10-CM | POA: Diagnosis not present

## 2020-01-23 DIAGNOSIS — Z86010 Personal history of colonic polyps: Secondary | ICD-10-CM | POA: Diagnosis not present

## 2020-01-30 DIAGNOSIS — R42 Dizziness and giddiness: Secondary | ICD-10-CM | POA: Diagnosis not present

## 2020-01-30 DIAGNOSIS — I10 Essential (primary) hypertension: Secondary | ICD-10-CM | POA: Diagnosis not present

## 2020-01-31 DIAGNOSIS — B181 Chronic viral hepatitis B without delta-agent: Secondary | ICD-10-CM | POA: Diagnosis not present

## 2020-01-31 DIAGNOSIS — K760 Fatty (change of) liver, not elsewhere classified: Secondary | ICD-10-CM | POA: Diagnosis not present

## 2020-02-04 DIAGNOSIS — H04123 Dry eye syndrome of bilateral lacrimal glands: Secondary | ICD-10-CM | POA: Diagnosis not present

## 2020-03-05 DIAGNOSIS — K645 Perianal venous thrombosis: Secondary | ICD-10-CM | POA: Diagnosis not present

## 2020-03-18 DIAGNOSIS — R109 Unspecified abdominal pain: Secondary | ICD-10-CM | POA: Diagnosis not present

## 2020-03-18 DIAGNOSIS — K5900 Constipation, unspecified: Secondary | ICD-10-CM | POA: Diagnosis not present

## 2020-03-18 DIAGNOSIS — K760 Fatty (change of) liver, not elsewhere classified: Secondary | ICD-10-CM | POA: Diagnosis not present

## 2020-03-18 DIAGNOSIS — M549 Dorsalgia, unspecified: Secondary | ICD-10-CM | POA: Diagnosis not present

## 2020-03-19 DIAGNOSIS — K649 Unspecified hemorrhoids: Secondary | ICD-10-CM | POA: Diagnosis not present

## 2020-03-19 DIAGNOSIS — K5909 Other constipation: Secondary | ICD-10-CM | POA: Diagnosis not present

## 2020-03-19 DIAGNOSIS — Z86010 Personal history of colonic polyps: Secondary | ICD-10-CM | POA: Diagnosis not present

## 2020-03-24 DIAGNOSIS — E785 Hyperlipidemia, unspecified: Secondary | ICD-10-CM | POA: Diagnosis not present

## 2020-03-24 DIAGNOSIS — K649 Unspecified hemorrhoids: Secondary | ICD-10-CM | POA: Diagnosis not present

## 2020-03-24 DIAGNOSIS — I10 Essential (primary) hypertension: Secondary | ICD-10-CM | POA: Diagnosis not present

## 2020-03-24 DIAGNOSIS — E119 Type 2 diabetes mellitus without complications: Secondary | ICD-10-CM | POA: Diagnosis not present

## 2020-04-03 DIAGNOSIS — Z86010 Personal history of colonic polyps: Secondary | ICD-10-CM | POA: Diagnosis not present

## 2020-04-03 DIAGNOSIS — Z20822 Contact with and (suspected) exposure to covid-19: Secondary | ICD-10-CM | POA: Diagnosis not present

## 2020-04-03 DIAGNOSIS — K635 Polyp of colon: Secondary | ICD-10-CM | POA: Diagnosis not present

## 2020-04-03 DIAGNOSIS — K625 Hemorrhage of anus and rectum: Secondary | ICD-10-CM | POA: Diagnosis not present

## 2020-04-03 DIAGNOSIS — K5900 Constipation, unspecified: Secondary | ICD-10-CM | POA: Diagnosis not present

## 2020-04-03 DIAGNOSIS — Z1211 Encounter for screening for malignant neoplasm of colon: Secondary | ICD-10-CM | POA: Diagnosis not present

## 2020-04-03 DIAGNOSIS — K640 First degree hemorrhoids: Secondary | ICD-10-CM | POA: Diagnosis not present

## 2020-04-03 DIAGNOSIS — K649 Unspecified hemorrhoids: Secondary | ICD-10-CM | POA: Diagnosis not present

## 2020-04-03 DIAGNOSIS — B181 Chronic viral hepatitis B without delta-agent: Secondary | ICD-10-CM | POA: Diagnosis not present

## 2020-04-03 DIAGNOSIS — D127 Benign neoplasm of rectosigmoid junction: Secondary | ICD-10-CM | POA: Diagnosis not present

## 2020-04-03 DIAGNOSIS — D123 Benign neoplasm of transverse colon: Secondary | ICD-10-CM | POA: Diagnosis not present

## 2020-04-03 DIAGNOSIS — Z01812 Encounter for preprocedural laboratory examination: Secondary | ICD-10-CM | POA: Diagnosis not present

## 2020-04-03 DIAGNOSIS — D120 Benign neoplasm of cecum: Secondary | ICD-10-CM | POA: Diagnosis not present

## 2020-04-07 DIAGNOSIS — E876 Hypokalemia: Secondary | ICD-10-CM | POA: Diagnosis not present

## 2020-04-07 DIAGNOSIS — E119 Type 2 diabetes mellitus without complications: Secondary | ICD-10-CM | POA: Diagnosis not present

## 2020-04-07 DIAGNOSIS — E0590 Thyrotoxicosis, unspecified without thyrotoxic crisis or storm: Secondary | ICD-10-CM | POA: Diagnosis not present

## 2020-04-08 DIAGNOSIS — K649 Unspecified hemorrhoids: Secondary | ICD-10-CM | POA: Diagnosis not present

## 2020-04-08 DIAGNOSIS — K644 Residual hemorrhoidal skin tags: Secondary | ICD-10-CM | POA: Diagnosis not present

## 2020-04-08 DIAGNOSIS — K648 Other hemorrhoids: Secondary | ICD-10-CM | POA: Diagnosis not present

## 2020-05-28 ENCOUNTER — Telehealth: Payer: Self-pay | Admitting: General Practice

## 2020-05-28 NOTE — Telephone Encounter (Signed)
Called patient to schedule follow up with pap smear.  Pt stated that she would think about and call back to schedule.

## 2020-08-12 DIAGNOSIS — Z79899 Other long term (current) drug therapy: Secondary | ICD-10-CM | POA: Diagnosis not present

## 2020-08-12 DIAGNOSIS — E0590 Thyrotoxicosis, unspecified without thyrotoxic crisis or storm: Secondary | ICD-10-CM | POA: Diagnosis not present

## 2020-08-12 DIAGNOSIS — E1169 Type 2 diabetes mellitus with other specified complication: Secondary | ICD-10-CM | POA: Diagnosis not present

## 2020-08-12 DIAGNOSIS — E7800 Pure hypercholesterolemia, unspecified: Secondary | ICD-10-CM | POA: Diagnosis not present

## 2020-08-12 DIAGNOSIS — E559 Vitamin D deficiency, unspecified: Secondary | ICD-10-CM | POA: Diagnosis not present

## 2020-08-12 DIAGNOSIS — Z7984 Long term (current) use of oral hypoglycemic drugs: Secondary | ICD-10-CM | POA: Diagnosis not present

## 2020-08-31 DIAGNOSIS — Z79899 Other long term (current) drug therapy: Secondary | ICD-10-CM | POA: Diagnosis not present

## 2020-08-31 DIAGNOSIS — E876 Hypokalemia: Secondary | ICD-10-CM | POA: Diagnosis not present

## 2020-10-06 DIAGNOSIS — E876 Hypokalemia: Secondary | ICD-10-CM | POA: Diagnosis not present

## 2020-10-06 DIAGNOSIS — E0590 Thyrotoxicosis, unspecified without thyrotoxic crisis or storm: Secondary | ICD-10-CM | POA: Diagnosis not present

## 2020-10-06 DIAGNOSIS — E782 Mixed hyperlipidemia: Secondary | ICD-10-CM | POA: Diagnosis not present

## 2020-10-06 DIAGNOSIS — E119 Type 2 diabetes mellitus without complications: Secondary | ICD-10-CM | POA: Diagnosis not present

## 2020-11-30 DIAGNOSIS — E876 Hypokalemia: Secondary | ICD-10-CM | POA: Diagnosis not present

## 2020-12-16 DIAGNOSIS — E119 Type 2 diabetes mellitus without complications: Secondary | ICD-10-CM | POA: Diagnosis not present

## 2021-02-24 DIAGNOSIS — M314 Aortic arch syndrome [Takayasu]: Secondary | ICD-10-CM | POA: Diagnosis not present

## 2021-02-24 DIAGNOSIS — Z8541 Personal history of malignant neoplasm of cervix uteri: Secondary | ICD-10-CM | POA: Diagnosis not present

## 2021-02-24 DIAGNOSIS — R0789 Other chest pain: Secondary | ICD-10-CM | POA: Diagnosis not present

## 2021-02-24 DIAGNOSIS — E1169 Type 2 diabetes mellitus with other specified complication: Secondary | ICD-10-CM | POA: Diagnosis not present

## 2021-03-30 DIAGNOSIS — B181 Chronic viral hepatitis B without delta-agent: Secondary | ICD-10-CM | POA: Diagnosis not present

## 2021-03-30 DIAGNOSIS — M314 Aortic arch syndrome [Takayasu]: Secondary | ICD-10-CM | POA: Diagnosis not present

## 2021-04-02 DIAGNOSIS — F419 Anxiety disorder, unspecified: Secondary | ICD-10-CM | POA: Insufficient documentation

## 2021-04-02 DIAGNOSIS — I6522 Occlusion and stenosis of left carotid artery: Secondary | ICD-10-CM | POA: Insufficient documentation

## 2021-04-02 DIAGNOSIS — M314 Aortic arch syndrome [Takayasu]: Secondary | ICD-10-CM | POA: Insufficient documentation

## 2021-04-02 DIAGNOSIS — E079 Disorder of thyroid, unspecified: Secondary | ICD-10-CM | POA: Insufficient documentation

## 2021-04-02 DIAGNOSIS — E119 Type 2 diabetes mellitus without complications: Secondary | ICD-10-CM | POA: Insufficient documentation

## 2021-04-02 DIAGNOSIS — I10 Essential (primary) hypertension: Secondary | ICD-10-CM | POA: Insufficient documentation

## 2021-04-02 DIAGNOSIS — F32A Depression, unspecified: Secondary | ICD-10-CM | POA: Insufficient documentation

## 2021-04-02 DIAGNOSIS — J45909 Unspecified asthma, uncomplicated: Secondary | ICD-10-CM | POA: Insufficient documentation

## 2021-04-05 DIAGNOSIS — E1169 Type 2 diabetes mellitus with other specified complication: Secondary | ICD-10-CM | POA: Diagnosis not present

## 2021-04-05 DIAGNOSIS — R109 Unspecified abdominal pain: Secondary | ICD-10-CM | POA: Diagnosis not present

## 2021-04-05 DIAGNOSIS — E876 Hypokalemia: Secondary | ICD-10-CM | POA: Diagnosis not present

## 2021-04-18 NOTE — Progress Notes (Signed)
12 Cardiology Office Note:    Date:  04/20/2021   ID:  SEARRA CARNATHAN, DOB 1970-09-21, MRN [id number]  PCP:  Tia Alert, PA  Cardiologist:  Shirlee More, MD   Referring MD: Jonathon Jordan, MD  ASSESSMENT:    1. Chest pain of uncertain etiology   2. Hypokalemia   3. Hypertension, unspecified type   4. Takayasu's arteritis ([place])   5. Diabetes mellitus without complication ([place])   6. Hyperthyroidism    PLAN:    In order of problems listed above:  Although her chest pain is nonanginal in nature she is a very high risk of coronary artery disease both with her multiple cardiovascular risk factors including hypertension hyperlipidemia and diabetes as well as her vasculitis.  I have asked her to take her beta-blocker daily she takes it as needed for palpitation and be set up for cardiac CTA and if high risk markers would need further evaluation and potentially revascularization. No longer on potassium supplement we will check a renal profile prior to angiography Stable continue current treatment including calcium channel blocker ACE inhibitor and taking a beta-blocker daily Not treated I suspect she has progressive disease and would benefit from referral back to vascular surgery I will explore this with her next issue she is very affable with me that she will not except after treatment at this time Currently not on diabetic therapy Continue statin  Next appointment 4 weeks   Medication Adjustments/Labs and Tests Ordered: Current medicines are reviewed at length with the patient today.  Concerns regarding medicines are outlined above.  Orders Placed This Encounter  Procedures   EKG 12-Lead   No orders of the defined types were placed in this encounter.    Chief Complaint  Patient presents with   Chest Pain  In her words this is not pain she just gets full in her chest when she eats and it makes her uncomfortable but it is occurring daily and this is how she said she felt  when she first presented with vasculitis.  History of Present Illness:    Molly Hardin is a 51 y.o. female type 2 diabetes hypothyroidism on suppressive therapy with methimazole hyperlipidemia hypokalemia with potassium repletion who is being seen today for the evaluation of chest pain associated with Takayasu disease at the request of Jonathon Jordan, MD. she was seen by rheumatology 03/30/2021 and is not on treatment for her vasculitis.  Recent sedimentation rate 03/30/2021 elevated at 41 CRP was low less than 5 random glucose was 160 GFR greater than 90 cc  She was seen in consultation cardiology Dr. Creta [facility] [place] 2017 referral was for palpitation and shortness of breath there is a notation that she has been previously evaluated at [facility] vascular medicine and managed conservatively for her vasculitis she was treated with a beta-blocker for palpitation.  This led to the cardiac testing below.  She was seen [facility] vascular July 2015 with intention for revascularization with either bilateral carotid subclavian artery bypass arch bilateral carotid bypass for her vasculitis.  At that time she was on suppressant therapy Milana Na Mab and her sedimentation rate had normalized.  10/06/2020 A1c 7.2% free T4 1.0  CTA chest 06/21/2012: 1.  Chronic complete occlusion of the left carotid artery at the origin secondary to inflammation / arteritis.  2.  Severe narrowing at the origin of the right common carotid artery.  3.  More generalized inflammation surrounding the origin of the  brachiocephalic artery and left subclavian artery.  4.  Vertebral arteries are patent.   Echo [facility] 11/16/2016: Summary  Normal left ventricular size and systolic function with no appreciable  segmental abnormality.  Ejection fraction is visually estimated at 60-65%  Normal right ventricle structure and function.  No significant valvular abnormalities   MPI  11/16/2016: Impression 1. No reversible ischemia or infarction.  2. Normal left ventricular wall motion.  3. Left ventricular ejection fraction is 81%.  4. Non invasive risk stratification*: Low   Is a very complex case. Her understanding is that she is here because of a fullness in her chest and occurs only after eating Is occurring more frequently now daily and makes her very on comfortable and has caused her to be apprehensive.  She says this is much as she felt in 2015 when diagnosed with vasculitis. Her understanding when she was seen at [facility] where she was not a candidate for surgical intervention but she never followed up. She has exercise intolerance of weakness and at times feels like she is going to faint with heavy activity like garden work she has exertional shortness of breath but no edema orthopnea is not having palpitation. In general she just does not feel well. She has no background history of heart disease congenital or rheumatic. Past Medical History:  Diagnosis Date   Anxiety    Asthma    Depression    Diabetes mellitus without complication ([place])    Hypertension    Left carotid artery occlusion    LVH (left ventricular hypertrophy) 03/2016   [facility]   Takayasu's arteritis ([place])    Thyroid disease    Hyperthyroid    No past surgical history on file.  Current Medications: Current Meds  Medication Sig   albuterol (VENTOLIN HFA) 108 (90 Base) MCG/ACT inhaler Inhale 2 puffs into the lungs every 6 (six) hours as needed for wheezing or shortness of breath.   amLODipine (NORVASC) 5 MG tablet Take 5 mg by mouth daily.   aspirin EC 325 MG tablet Take 1 tablet (325 mg total) by mouth daily.   calcium-vitamin D (OSCAL WITH D) 500-200 MG-UNIT tablet Take 1 tablet by mouth.   CONTOUR NEXT TEST test strip USE 1 STRIP TO CHECK GLUCOSE ONCE DAILY   fluticasone (FLONASE) 50 MCG/ACT nasal spray Place 2 sprays into both nostrils daily.   LIPITOR 10 MG  tablet    lisinopril (PRINIVIL,ZESTRIL) 20 MG tablet Take 20 mg by mouth 2 (two) times daily.   metoprolol succinate (TOPROL-XL) 25 MG 24 hr tablet    Microlet Lancets MISC      Allergies:   Pepcid complete [famotidine-ca carb-mag hydrox]   Social History   Socioeconomic History   Marital status: Married    Spouse name: Not on file   Number of children: Not on file   Years of education: Not on file   Highest education level: Not on file  Occupational History   Not on file  Tobacco Use   Smoking status: Former    Types: Cigarettes    Quit date: 10/17/2010    Years since quitting: 10.5   Smokeless tobacco: Never  Substance and Sexual Activity   Alcohol use: No   Drug use: No   Sexual activity: Not Currently    Birth control/protection: None  Other Topics Concern   Not on file  Social History Narrative   Not on file   Social Determinants of Health   Financial Resource Strain:  Not on file  Food Insecurity: Not on file  Transportation Needs: Not on file  Physical Activity: Not on file  Stress: Not on file  Social Connections: Not on file     Family History: The patient's family history includes Arthritis in her mother; Diabetes in her father and mother; Hypertension in her brother, brother, father, mother, sister, sister, sister, and sister.  ROS:   ROS Please see the history of present illness.     All other systems reviewed and are negative.  EKGs/Labs/Other Studies Reviewed:    The following studies were reviewed today:   EKG:  EKG is  ordered today.  The ekg ordered today is personally reviewed and demonstrates sinus rhythm and is normal  Lipid panel 09/18/2019: Cholesterol 124 triglycerides 232 HDL 45 non-HDL cholesterol 79 LDL 61  Physical Exam:    VS:  BP 136/84 (BP Location: Right Arm, Patient Position: Sitting, Cuff Size: Normal)    Pulse 88    Ht 5\' 1"  (1.549 m)    Wt 173 lb 12.8 oz (78.8 kg)    SpO2 96%    BMI 32.84 kg/m     Wt Readings from Last  3 Encounters:  04/20/21 173 lb 12.8 oz (78.8 kg)  09/23/19 185 lb (83.9 kg)  08/30/19 186 lb 1.9 oz (84.4 kg)     GEN: She appears her age does not look chronically ill well nourished, well developed in no acute distress HEENT: Normal NECK: No JVD; she has bilateral soft carotid bruits no subclavian bruit full pulses in the upper extremities LYMPHATICS: No lymphadenopathy CARDIAC: RRR, no murmurs, rubs, gallops RESPIRATORY:  Clear to auscultation without rales, wheezing or rhonchi  ABDOMEN: Soft, non-tender, non-distended MUSCULOSKELETAL:  No edema; No deformity  SKIN: Warm and dry NEUROLOGIC:  Alert and oriented x 3 PSYCHIATRIC:  Normal affect     Signed, Shirlee More, MD  04/20/2021 9:07 AM    [place]

## 2021-04-20 ENCOUNTER — Encounter: Payer: Self-pay | Admitting: Cardiology

## 2021-04-20 ENCOUNTER — Other Ambulatory Visit: Payer: Self-pay

## 2021-04-20 ENCOUNTER — Ambulatory Visit (INDEPENDENT_AMBULATORY_CARE_PROVIDER_SITE_OTHER): Payer: 59 | Admitting: Cardiology

## 2021-04-20 VITALS — BP 136/84 | HR 88 | Ht 61.0 in | Wt 173.8 lb

## 2021-04-20 DIAGNOSIS — E876 Hypokalemia: Secondary | ICD-10-CM | POA: Diagnosis not present

## 2021-04-20 DIAGNOSIS — R072 Precordial pain: Secondary | ICD-10-CM | POA: Diagnosis not present

## 2021-04-20 DIAGNOSIS — M314 Aortic arch syndrome [Takayasu]: Secondary | ICD-10-CM

## 2021-04-20 DIAGNOSIS — E119 Type 2 diabetes mellitus without complications: Secondary | ICD-10-CM

## 2021-04-20 DIAGNOSIS — I10 Essential (primary) hypertension: Secondary | ICD-10-CM

## 2021-04-20 DIAGNOSIS — E269 Hyperaldosteronism, unspecified: Secondary | ICD-10-CM

## 2021-04-20 DIAGNOSIS — R079 Chest pain, unspecified: Secondary | ICD-10-CM

## 2021-04-20 DIAGNOSIS — E7800 Pure hypercholesterolemia, unspecified: Secondary | ICD-10-CM

## 2021-04-20 NOTE — Patient Instructions (Signed)
Medication Instructions:  Your physician has recommended you make the following change in your medication:  INCREASE: Metoprolol to daily *If you need a refill on your cardiac medications before your next appointment, please call your pharmacy*   Lab Work: Your physician recommends that you return for lab work in: Within one week of your cardiac CT BMP If you have labs (blood work) drawn today and your tests are completely normal, you will receive your results only by: [place] (if you have MyChart) OR A paper copy in the mail If you have any lab test that is abnormal or we need to change your treatment, we will call you to review the results.   Testing/Procedures:   Your cardiac CT will be scheduled at the below location:   [facility] [address] [place], [place] [address] [phone]  If scheduled at [facility], please arrive at the [facility] main entrance (entrance A) of [facility] 30 minutes prior to test start time. You can use the FREE valet parking offered at the main entrance (encouraged to control the heart rate for the test) Proceed to the [facility] Radiology Department (first floor) to check-in and test prep.  Please follow these instructions carefully (unless otherwise directed):  On the Night Before the Test: Be sure to Drink plenty of water. Do not consume any caffeinated/decaffeinated beverages or chocolate 12 hours prior to your test. Do not take any antihistamines 12 hours prior to your test.  On the Day of the Test: Drink plenty of water until 1 hour prior to the test. Do not eat any food 4 hours prior to the test. You may take your regular medications prior to the test.  Take metoprolol two hours prior to test. FEMALES- please wear underwire-free bra if available, avoid dresses & tight clothing       After the Test: Drink plenty of water. After receiving IV contrast, you may experience a mild flushed  feeling. This is normal. On occasion, you may experience a mild rash up to 24 hours after the test. This is not dangerous. If this occurs, you can take Benadryl 25 mg and increase your fluid intake. If you experience trouble breathing, this can be serious. If it is severe call 911 IMMEDIATELY. If it is mild, please call our office. If you take any of these medications: Glipizide/Metformin, Avandament, Glucavance, please do not take 48 hours after completing test unless otherwise instructed.  Please allow 2-4 weeks for scheduling of routine cardiac CTs. Some insurance companies require a pre-authorization which may delay scheduling of this test.   For non-scheduling related questions, please contact the cardiac imaging nurse navigator should you have any questions/concerns: Marchia Bond, Cardiac Imaging Nurse Navigator [name], Cardiac Imaging Nurse Navigator [facility] Direct Office Dial: [phone]   For scheduling needs, including cancellations and rescheduling, please call [place], [phone].    Follow-Up: At [facility], you and your health needs are our priority.  As part of our continuing mission to provide you with exceptional heart care, we have created designated Provider Care Teams.  These Care Teams include your primary Cardiologist (physician) and Advanced Practice Providers (APPs -  Physician Assistants and Nurse Practitioners) who all work together to provide you with the care you need, when you need it.  We recommend signing up for the patient portal called "MyChart".  Sign up information is provided on this After Visit Summary.  MyChart is used to  connect with patients for Virtual Visits (Telemedicine).  Patients are able to view lab/test results, encounter notes, upcoming appointments, etc.  Non-urgent messages can be sent to your provider as well.   To learn more about what you can do with MyChart, go to [email or web address].     Your next appointment:   4 week(s)  The format for your next appointment:   In Person  Provider:   Shirlee More, MD    Other Instructions

## 2021-04-20 NOTE — Addendum Note (Signed)
Addended by: Resa Miner I on: 04/20/2021 09:22 AM   Modules accepted: Orders

## 2021-04-20 NOTE — Addendum Note (Signed)
Addended by: Resa Miner I on: 04/20/2021 09:17 AM   Modules accepted: Orders

## 2021-04-29 ENCOUNTER — Telehealth (HOSPITAL_COMMUNITY): Payer: Self-pay | Admitting: *Deleted

## 2021-04-29 NOTE — Telephone Encounter (Signed)
Attempted to call patient regarding upcoming cardiac CT appointment. °Left message on voicemail with name and callback number ° °  RN Navigator Cardiac Imaging °[facility] °[phone] Office °[phone] Cell ° °

## 2021-04-29 NOTE — Telephone Encounter (Signed)
Patient returning call regarding upcoming cardiac imaging study; pt verbalizes understanding of appt date/time, parking situation and where to check in, pre-test NPO status and medications ordered, and verified current allergies; name and call back number provided for further questions should they arise  Molly Clement RN Navigator Cardiac Imaging [facility] [phone] office [phone] cell  She is to take 50mg  metoprolol succinate two hours prior to cardiac CT scan. She is aware to arrive at 9:30am for her 10am scan.

## 2021-04-30 ENCOUNTER — Ambulatory Visit (HOSPITAL_COMMUNITY)
Admission: RE | Admit: 2021-04-30 | Discharge: 2021-04-30 | Disposition: A | Discharge status: Home / Self Care | Payer: 59 | Source: Ambulatory Visit | Attending: Cardiology | Admitting: Cardiology

## 2021-04-30 ENCOUNTER — Other Ambulatory Visit: Payer: Self-pay

## 2021-04-30 ENCOUNTER — Ambulatory Visit (HOSPITAL_COMMUNITY): Payer: Medicare Other

## 2021-04-30 ENCOUNTER — Encounter (HOSPITAL_COMMUNITY): Payer: Self-pay

## 2021-04-30 DIAGNOSIS — R079 Chest pain, unspecified: Secondary | ICD-10-CM | POA: Diagnosis not present

## 2021-04-30 DIAGNOSIS — R072 Precordial pain: Secondary | ICD-10-CM | POA: Diagnosis not present

## 2021-04-30 MED ORDER — METOPROLOL TARTRATE 5 MG/5ML IV SOLN: 10.0000 mg | Freq: Once | INTRAVENOUS | Status: AC

## 2021-04-30 MED ORDER — METOPROLOL TARTRATE 5 MG/5ML IV SOLN
INTRAVENOUS | Status: AC
  Administered 2021-04-30: 10 mg via INTRAVENOUS
  Filled 2021-04-30: qty 10

## 2021-04-30 MED ORDER — IOHEXOL 350 MG/ML SOLN
100.0000 mL | Freq: Once | INTRAVENOUS | Status: AC | PRN
  Administered 2021-04-30: 95 mL via INTRAVENOUS

## 2021-04-30 MED ORDER — NITROGLYCERIN 0.4 MG SL SUBL: 0.8000 mg | SUBLINGUAL_TABLET | Freq: Once | SUBLINGUAL | Status: AC

## 2021-04-30 MED ORDER — NITROGLYCERIN 0.4 MG SL SUBL
SUBLINGUAL_TABLET | SUBLINGUAL | Status: AC
  Administered 2021-04-30: 0.8 mg via SUBLINGUAL
  Filled 2021-04-30: qty 2

## 2021-06-23 DIAGNOSIS — R059 Cough, unspecified: Secondary | ICD-10-CM | POA: Diagnosis not present

## 2021-06-23 DIAGNOSIS — E1169 Type 2 diabetes mellitus with other specified complication: Secondary | ICD-10-CM | POA: Diagnosis not present

## 2021-06-23 DIAGNOSIS — R079 Chest pain, unspecified: Secondary | ICD-10-CM | POA: Diagnosis not present

## 2021-06-30 ENCOUNTER — Ambulatory Visit: Payer: Medicare Other | Admitting: Cardiology

## 2021-08-18 NOTE — Progress Notes (Deleted)
Cardiology Office Note:    Date:  08/18/2021   ID:  Molly Hardin, DOB Feb 14, 1971, MRN [id number]  PCP:  Pcp, No  Cardiologist:  Shirlee More, MD    Referring MD: No ref. provider found    ASSESSMENT:    No diagnosis found. PLAN:    In order of problems listed above:  ***   Next appointment: ***   Medication Adjustments/Labs and Tests Ordered: Current medicines are reviewed at length with the patient today.  Concerns regarding medicines are outlined above.  No orders of the defined types were placed in this encounter.  No orders of the defined types were placed in this encounter.   No chief complaint on file.   History of Present Illness:    Molly Hardin is a 51 y.o. female with a hx of type 2 diabetes hypothyroidism and suppressive therapy with methimazole hyperlipidemia hypokalemia with potassium repletion and a history of tachycardia yesterday disease evaluated and treated by rheumatology.  She was last seen 04/20/2021 with complaints of chest pain. 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 Past Medical History:  Diagnosis Date   Anxiety    Asthma    Depression    Diabetes mellitus without complication ([place])    Hypertension    Left carotid artery occlusion    LVH (left ventricular hypertrophy) 03/2016   [facility]   Takayasu's arteritis ([place])    Thyroid disease    Hyperthyroid    No past surgical history on file.  Current Medications: No outpatient medications have been marked as taking for the 08/19/21 encounter (Appointment) with Richardo Priest, MD.     Allergies:   Pepcid complete [famotidine-ca carb-mag hydrox]   Social History   Socioeconomic History   Marital status: Married    Spouse name: Not on file   Number of children:  Not on file   Years of education: Not on file   Highest education level: Not on file  Occupational History   Not on file  Tobacco Use   Smoking status: Former    Types: Cigarettes    Quit date: 10/17/2010    Years since quitting: 10.8   Smokeless tobacco: Never  Substance and Sexual Activity   Alcohol use: No   Drug use: No   Sexual activity: Not Currently    Birth control/protection: None  Other Topics Concern   Not on file  Social History Narrative   Not on file   Social Determinants of Health   Financial Resource Strain: Not on file  Food Insecurity: Not on file  Transportation Needs: Not on file  Physical Activity: Not on file  Stress: Not on file  Social Connections: Not on file     Family History: The patient's ***family history includes Arthritis in her mother; Diabetes in her father and mother; Hypertension in her brother, brother, father, mother, sister, sister, sister, and sister. ROS:   Please see the history of present illness.    All other systems reviewed and are negative.  EKGs/Labs/Other Studies Reviewed:    The following studies were reviewed today:  EKG:  EKG ordered today and personally reviewed.  The ekg ordered today demonstrates ***  Recent Labs: No results found for requested labs within last 8760 hours.  Recent Lipid Panel No results found for: CHOL, TRIG,  HDL, CHOLHDL, VLDL, LDLCALC, LDLDIRECT  Physical Exam:    VS:  There were no vitals taken for this visit.    Wt Readings from Last 3 Encounters:  04/20/21 173 lb 12.8 oz (78.8 kg)  09/23/19 185 lb (83.9 kg)  08/30/19 186 lb 1.9 oz (84.4 kg)     GEN: *** Well nourished, well developed in no acute distress HEENT: Normal NECK: No JVD; No carotid bruits LYMPHATICS: No lymphadenopathy CARDIAC: ***RRR, no murmurs, rubs, gallops RESPIRATORY:  Clear to auscultation without rales, wheezing or rhonchi  ABDOMEN: Soft, non-tender, non-distended MUSCULOSKELETAL:  No edema; No deformity   SKIN: Warm and dry NEUROLOGIC:  Alert and oriented x 3 PSYCHIATRIC:  Normal affect    Signed, Shirlee More, MD  08/18/2021 12:46 PM     [facility]

## 2021-08-19 ENCOUNTER — Ambulatory Visit: Payer: Medicare Other | Admitting: Cardiology

## 2021-08-19 DIAGNOSIS — R931 Abnormal findings on diagnostic imaging of heart and coronary circulation: Secondary | ICD-10-CM

## 2021-08-19 DIAGNOSIS — I2510 Atherosclerotic heart disease of native coronary artery without angina pectoris: Secondary | ICD-10-CM

## 2021-09-02 DIAGNOSIS — Z0000 Encounter for general adult medical examination without abnormal findings: Secondary | ICD-10-CM | POA: Diagnosis not present

## 2021-09-02 DIAGNOSIS — E1169 Type 2 diabetes mellitus with other specified complication: Secondary | ICD-10-CM | POA: Diagnosis not present

## 2021-09-02 DIAGNOSIS — M314 Aortic arch syndrome [Takayasu]: Secondary | ICD-10-CM | POA: Diagnosis not present

## 2021-09-02 DIAGNOSIS — Z79899 Other long term (current) drug therapy: Secondary | ICD-10-CM | POA: Diagnosis not present

## 2021-09-02 DIAGNOSIS — E1342 Other specified diabetes mellitus with diabetic polyneuropathy: Secondary | ICD-10-CM | POA: Diagnosis not present

## 2021-09-02 DIAGNOSIS — E0590 Thyrotoxicosis, unspecified without thyrotoxic crisis or storm: Secondary | ICD-10-CM | POA: Diagnosis not present

## 2021-09-02 DIAGNOSIS — E876 Hypokalemia: Secondary | ICD-10-CM | POA: Diagnosis not present

## 2021-09-07 ENCOUNTER — Other Ambulatory Visit: Payer: Self-pay

## 2021-09-07 DIAGNOSIS — E119 Type 2 diabetes mellitus without complications: Secondary | ICD-10-CM | POA: Diagnosis not present

## 2021-09-07 DIAGNOSIS — Z79899 Other long term (current) drug therapy: Secondary | ICD-10-CM | POA: Diagnosis not present

## 2021-09-07 DIAGNOSIS — E782 Mixed hyperlipidemia: Secondary | ICD-10-CM | POA: Diagnosis not present

## 2021-09-07 DIAGNOSIS — I10 Essential (primary) hypertension: Secondary | ICD-10-CM | POA: Diagnosis not present

## 2021-09-07 DIAGNOSIS — E0590 Thyrotoxicosis, unspecified without thyrotoxic crisis or storm: Secondary | ICD-10-CM | POA: Diagnosis not present

## 2021-09-07 DIAGNOSIS — Z7984 Long term (current) use of oral hypoglycemic drugs: Secondary | ICD-10-CM | POA: Diagnosis not present

## 2021-09-13 NOTE — Progress Notes (Deleted)
Cardiology Office Note:    Date:  09/14/2021   ID:  Molly Hardin, DOB 1970/12/07, MRN [id number]  PCP:  Pcp, No  Cardiologist:  Shirlee More, MD    Referring MD: No ref. provider found    ASSESSMENT:    1. Agatston coronary artery calcium score less than 100   2. Mild CAD   3. Primary hypertension   4. Pure hypercholesterolemia   5. Takayasu's arteritis ([place])    PLAN:    In order of problems listed above:  ***   Next appointment: ***   Medication Adjustments/Labs and Tests Ordered: Current medicines are reviewed at length with the patient today.  Concerns regarding medicines are outlined above.  No orders of the defined types were placed in this encounter.  No orders of the defined types were placed in this encounter.   No chief complaint on file.   History of Present Illness:    Molly Hardin is a 51 y.o. female with a hx of Takayasu aortitis type 2 diabetes hypothyroidism on suppressant therapy with methimazole hyperlipidemia last seen 04/20/2020 for chest pain.  She has followed with rheumatology and is not on treatment at her choice.  Her most recent sedimentation rate was elevated at 51 and CRP was low at less than 5.  Previous evaluation vascular surgery has been at [facility] and CT of the chest in 2014 showed occlusion of the left internal carotid at its origin severe narrowing the origin of the right common carotid artery and she opted not to have surgical revascularization..  She is followed by both rheumatology and endocrinology [facility].  Compliance with diet, lifestyle and medications: ***  She had a cardiac CTA reported 05/12/2021 with a calcium score of 51.3/95th percentile.  She had nonobstructive CAD present involving the right coronary artery 25 to 49% and extensive calcification of the thoracic ascending aorta and arch due to aortitis.     Past Medical History:  Diagnosis Date   Abdominal pain 07/22/2015   Amenorrhea  07/22/2015   Anxiety    Aortic arch syndrome (takayasu) ([place]) 04/22/2013   Asthma    Carotid bruit 07/22/2015   Chest pain 11/15/2016   Chronic hepatitis B ([place]) 07/15/2004   Chronic midline thoracic back pain 05/23/2016   Depression    Diabetes mellitus without complication ([place])    Drug therapy 10/19/2015   Dysphagia 07/22/2015   Essential hypertension 07/22/2015   Fibromyalgia 07/22/2015   Functional GI complaint 08/20/2019   Gastro-esophageal reflux disease with esophagitis 07/22/2015   Gestational diabetes mellitus 07/22/2015   HBV (hepatitis B virus) infection 07/15/2004   Heartburn 10/19/2015   Hyperlipidemia 11/15/2016   Hypermetropia of both eyes 10/13/2017   Hypertension    Hyperthyroidism 05/04/2017   Hypokalemia 07/22/2015   Keratoconjunctivitis sicca of both eyes not specified as Sjogren's 10/13/2017   Left carotid artery occlusion    Lower back pain 07/22/2015   LVH (left ventricular hypertrophy) 03/2016   [facility]   Malignant neoplasm of cervix uteri, unspecified ([place]) 11/08/2011   Mixed hyperlipidemia 11/15/2016   Takayasu's arteritis ([place])    Thyroid disease    Hyperthyroid   Type 2 diabetes mellitus without complication, without long-term current use of insulin ([place]) 11/15/2016   Vitreous floaters of both eyes 10/13/2017    No past surgical history on file.  Current Medications: No outpatient medications have been marked as taking for the 09/14/21 encounter (Appointment) with Richardo Priest, MD.  Allergies:   Pepcid complete [famotidine-ca carb-mag hydrox] and Famotidine   Social History   Socioeconomic History   Marital status: Married    Spouse name: Not on file   Number of children: Not on file   Years of education: Not on file   Highest education level: Not on file  Occupational History   Not on file  Tobacco Use   Smoking status: Former    Types: Cigarettes    Quit date: 10/17/2010    Years since quitting: 10.9   Smokeless tobacco: Never  Substance and  Sexual Activity   Alcohol use: No   Drug use: No   Sexual activity: Not Currently    Birth control/protection: None  Other Topics Concern   Not on file  Social History Narrative   Not on file   Social Determinants of Health   Financial Resource Strain: Not on file  Food Insecurity: Not on file  Transportation Needs: Not on file  Physical Activity: Not on file  Stress: Not on file  Social Connections: Not on file     Family History: The patient's  family history includes Arthritis in her mother; Diabetes in her father and mother; Hypertension in her brother, brother, father, mother, sister, sister, sister, and sister. ROS:   Please see the history of present illness.    All other systems reviewed and are negative.  EKGs/Labs/Other Studies Reviewed:    The following studies were reviewed today:  EKG:  EKG ordered today and personally reviewed.  The ekg ordered today demonstratesRecent Labs: No results found for requested labs within last 8760 hours.  Recent Lipid Panel 09/18/2019: Cholesterol 124 LDL 61 triglycerides 234 HDL 45 Recent labs 05/23/2023hemoglobin A1c 7.3% glucose 132 Physical Exam:    VS:  There were no vitals taken for this visit.    Wt Readings from Last 3 Encounters:  04/20/21 173 lb 12.8 oz (78.8 kg)  09/23/19 185 lb (83.9 kg)  08/30/19 186 lb 1.9 oz (84.4 kg)     GEN: *** Well nourished, well developed in no acute distress HEENT: Normal NECK: No JVD; No carotid bruits LYMPHATICS: No lymphadenopathy CARDIAC: ***RRR, no murmurs, rubs, gallops RESPIRATORY:  Clear to auscultation without rales, wheezing or rhonchi  ABDOMEN: Soft, non-tender, non-distended MUSCULOSKELETAL:  No edema; No deformity  SKIN: Warm and dry NEUROLOGIC:  Alert and oriented x 3 PSYCHIATRIC:  Normal affect    Signed, Shirlee More, MD  09/14/2021 7:41 AM    [place]

## 2021-09-14 ENCOUNTER — Ambulatory Visit: Payer: Medicare Other | Admitting: Cardiology

## 2021-12-13 DIAGNOSIS — M461 Sacroiliitis, not elsewhere classified: Secondary | ICD-10-CM | POA: Diagnosis not present

## 2022-01-12 DIAGNOSIS — S0501XA Injury of conjunctiva and corneal abrasion without foreign body, right eye, initial encounter: Secondary | ICD-10-CM | POA: Diagnosis not present

## 2022-03-09 DIAGNOSIS — Z79899 Other long term (current) drug therapy: Secondary | ICD-10-CM | POA: Diagnosis not present

## 2022-03-09 DIAGNOSIS — E119 Type 2 diabetes mellitus without complications: Secondary | ICD-10-CM | POA: Diagnosis not present

## 2022-03-09 DIAGNOSIS — R946 Abnormal results of thyroid function studies: Secondary | ICD-10-CM | POA: Diagnosis not present

## 2022-03-09 DIAGNOSIS — R42 Dizziness and giddiness: Secondary | ICD-10-CM | POA: Diagnosis not present

## 2022-03-09 DIAGNOSIS — I10 Essential (primary) hypertension: Secondary | ICD-10-CM | POA: Diagnosis not present

## 2022-03-11 DIAGNOSIS — E0590 Thyrotoxicosis, unspecified without thyrotoxic crisis or storm: Secondary | ICD-10-CM | POA: Diagnosis not present

## 2022-03-11 DIAGNOSIS — Z79899 Other long term (current) drug therapy: Secondary | ICD-10-CM | POA: Diagnosis not present

## 2022-03-11 DIAGNOSIS — Z7984 Long term (current) use of oral hypoglycemic drugs: Secondary | ICD-10-CM | POA: Diagnosis not present

## 2022-03-11 DIAGNOSIS — Z87891 Personal history of nicotine dependence: Secondary | ICD-10-CM | POA: Diagnosis not present

## 2022-03-11 DIAGNOSIS — E119 Type 2 diabetes mellitus without complications: Secondary | ICD-10-CM | POA: Diagnosis not present

## 2022-03-11 DIAGNOSIS — I10 Essential (primary) hypertension: Secondary | ICD-10-CM | POA: Diagnosis not present

## 2022-03-17 DIAGNOSIS — E042 Nontoxic multinodular goiter: Secondary | ICD-10-CM | POA: Diagnosis not present

## 2022-03-17 DIAGNOSIS — E0590 Thyrotoxicosis, unspecified without thyrotoxic crisis or storm: Secondary | ICD-10-CM | POA: Diagnosis not present

## 2022-03-29 DIAGNOSIS — J020 Streptococcal pharyngitis: Secondary | ICD-10-CM | POA: Diagnosis not present

## 2022-04-27 DIAGNOSIS — M79671 Pain in right foot: Secondary | ICD-10-CM | POA: Diagnosis not present

## 2022-04-27 DIAGNOSIS — E1342 Other specified diabetes mellitus with diabetic polyneuropathy: Secondary | ICD-10-CM | POA: Diagnosis not present

## 2022-07-04 DIAGNOSIS — R142 Eructation: Secondary | ICD-10-CM | POA: Diagnosis not present

## 2022-07-04 DIAGNOSIS — M314 Aortic arch syndrome [Takayasu]: Secondary | ICD-10-CM | POA: Diagnosis not present

## 2022-07-04 DIAGNOSIS — K148 Other diseases of tongue: Secondary | ICD-10-CM | POA: Diagnosis not present

## 2022-07-09 DIAGNOSIS — R1310 Dysphagia, unspecified: Secondary | ICD-10-CM | POA: Diagnosis not present

## 2022-07-09 DIAGNOSIS — E876 Hypokalemia: Secondary | ICD-10-CM | POA: Diagnosis not present

## 2022-07-09 DIAGNOSIS — J441 Chronic obstructive pulmonary disease with (acute) exacerbation: Secondary | ICD-10-CM | POA: Diagnosis not present

## 2022-07-09 DIAGNOSIS — I700 Atherosclerosis of aorta: Secondary | ICD-10-CM | POA: Diagnosis not present

## 2022-07-09 DIAGNOSIS — K30 Functional dyspepsia: Secondary | ICD-10-CM | POA: Diagnosis not present

## 2022-07-09 DIAGNOSIS — R0789 Other chest pain: Secondary | ICD-10-CM | POA: Diagnosis not present

## 2022-07-09 DIAGNOSIS — R109 Unspecified abdominal pain: Secondary | ICD-10-CM | POA: Diagnosis not present

## 2022-07-09 DIAGNOSIS — R142 Eructation: Secondary | ICD-10-CM | POA: Diagnosis not present

## 2022-07-09 DIAGNOSIS — M314 Aortic arch syndrome [Takayasu]: Secondary | ICD-10-CM | POA: Diagnosis not present

## 2022-07-10 DIAGNOSIS — R9431 Abnormal electrocardiogram [ECG] [EKG]: Secondary | ICD-10-CM | POA: Diagnosis not present

## 2022-09-23 DIAGNOSIS — Z7984 Long term (current) use of oral hypoglycemic drugs: Secondary | ICD-10-CM | POA: Diagnosis not present

## 2022-09-23 DIAGNOSIS — I10 Essential (primary) hypertension: Secondary | ICD-10-CM | POA: Diagnosis not present

## 2022-09-23 DIAGNOSIS — E782 Mixed hyperlipidemia: Secondary | ICD-10-CM | POA: Diagnosis not present

## 2022-09-23 DIAGNOSIS — E0590 Thyrotoxicosis, unspecified without thyrotoxic crisis or storm: Secondary | ICD-10-CM | POA: Diagnosis not present

## 2022-09-23 DIAGNOSIS — E119 Type 2 diabetes mellitus without complications: Secondary | ICD-10-CM | POA: Diagnosis not present

## 2022-09-28 DIAGNOSIS — E119 Type 2 diabetes mellitus without complications: Secondary | ICD-10-CM | POA: Diagnosis not present

## 2022-09-28 DIAGNOSIS — E782 Mixed hyperlipidemia: Secondary | ICD-10-CM | POA: Diagnosis not present

## 2022-09-28 DIAGNOSIS — E0590 Thyrotoxicosis, unspecified without thyrotoxic crisis or storm: Secondary | ICD-10-CM | POA: Diagnosis not present

## 2022-09-28 DIAGNOSIS — I10 Essential (primary) hypertension: Secondary | ICD-10-CM | POA: Diagnosis not present

## 2022-10-04 ENCOUNTER — Other Ambulatory Visit (HOSPITAL_COMMUNITY)
Admission: RE | Admit: 2022-10-04 | Discharge: 2022-10-04 | Disposition: A | Discharge status: Home / Self Care | Payer: 59 | Source: Ambulatory Visit | Attending: Family Medicine | Admitting: Family Medicine

## 2022-10-04 DIAGNOSIS — Z01411 Encounter for gynecological examination (general) (routine) with abnormal findings: Secondary | ICD-10-CM | POA: Insufficient documentation

## 2022-10-04 DIAGNOSIS — E119 Type 2 diabetes mellitus without complications: Secondary | ICD-10-CM | POA: Diagnosis not present

## 2022-10-04 DIAGNOSIS — E559 Vitamin D deficiency, unspecified: Secondary | ICD-10-CM | POA: Diagnosis not present

## 2022-10-06 LAB — CYTOLOGY - PAP
Diagnosis: NEGATIVE
Diagnosis: REACTIVE

## 2022-10-10 DIAGNOSIS — H9313 Tinnitus, bilateral: Secondary | ICD-10-CM | POA: Diagnosis not present

## 2022-10-10 DIAGNOSIS — K148 Other diseases of tongue: Secondary | ICD-10-CM | POA: Diagnosis not present

## 2022-10-10 DIAGNOSIS — L299 Pruritus, unspecified: Secondary | ICD-10-CM | POA: Diagnosis not present

## 2022-11-03 DIAGNOSIS — E0590 Thyrotoxicosis, unspecified without thyrotoxic crisis or storm: Secondary | ICD-10-CM | POA: Diagnosis not present

## 2022-11-23 DIAGNOSIS — K148 Other diseases of tongue: Secondary | ICD-10-CM | POA: Diagnosis not present

## 2022-11-23 DIAGNOSIS — H9313 Tinnitus, bilateral: Secondary | ICD-10-CM | POA: Diagnosis not present

## 2022-11-23 DIAGNOSIS — C029 Malignant neoplasm of tongue, unspecified: Secondary | ICD-10-CM | POA: Diagnosis not present

## 2022-11-28 ENCOUNTER — Other Ambulatory Visit (HOSPITAL_COMMUNITY): Payer: Self-pay | Admitting: Otolaryngology

## 2022-11-28 DIAGNOSIS — C021 Malignant neoplasm of border of tongue: Secondary | ICD-10-CM

## 2022-11-30 ENCOUNTER — Ambulatory Visit (HOSPITAL_COMMUNITY)
Admission: RE | Admit: 2022-11-30 | Discharge: 2022-11-30 | Disposition: A | Discharge status: Home / Self Care | Payer: 59 | Source: Ambulatory Visit | Attending: Otolaryngology | Admitting: Otolaryngology

## 2022-11-30 DIAGNOSIS — I6522 Occlusion and stenosis of left carotid artery: Secondary | ICD-10-CM | POA: Diagnosis not present

## 2022-11-30 DIAGNOSIS — C029 Malignant neoplasm of tongue, unspecified: Secondary | ICD-10-CM | POA: Diagnosis not present

## 2022-11-30 DIAGNOSIS — C021 Malignant neoplasm of border of tongue: Secondary | ICD-10-CM | POA: Diagnosis not present

## 2022-11-30 LAB — POCT I-STAT CREATININE: Creatinine, Ser: 0.5 mg/dL (ref 0.44–1.00)

## 2022-11-30 MED ORDER — SODIUM CHLORIDE (PF) 0.9 % IJ SOLN
INTRAMUSCULAR | Status: AC
  Filled 2022-11-30: qty 50

## 2022-11-30 MED ORDER — IOHEXOL 300 MG/ML  SOLN
80.0000 mL | Freq: Once | INTRAMUSCULAR | Status: AC | PRN
  Administered 2022-11-30: 80 mL via INTRAVENOUS

## 2022-12-05 ENCOUNTER — Other Ambulatory Visit: Payer: Self-pay | Admitting: Otolaryngology

## 2022-12-05 DIAGNOSIS — I6523 Occlusion and stenosis of bilateral carotid arteries: Secondary | ICD-10-CM

## 2022-12-26 DIAGNOSIS — C029 Malignant neoplasm of tongue, unspecified: Secondary | ICD-10-CM | POA: Diagnosis not present

## 2022-12-26 DIAGNOSIS — I6523 Occlusion and stenosis of bilateral carotid arteries: Secondary | ICD-10-CM | POA: Diagnosis not present

## 2022-12-26 DIAGNOSIS — K219 Gastro-esophageal reflux disease without esophagitis: Secondary | ICD-10-CM | POA: Diagnosis not present

## 2023-01-05 ENCOUNTER — Other Ambulatory Visit: Payer: Self-pay | Admitting: Otolaryngology

## 2023-01-05 NOTE — Pre-Procedure Instructions (Signed)
Surgical Instructions   Your procedure is scheduled on Wednesday, September 25th. Report to [facility] Main Entrance "A" at 12:30 P.M., then check in with the Admitting office. Any questions or running late day of surgery: call [phone]  Questions prior to your surgery date: call [phone], Monday-Friday, 8am-4pm. If you experience any cold or flu symptoms such as cough, fever, chills, shortness of breath, etc. between now and your scheduled surgery, please notify us at the above number.     Remember:  Do not eat after midnight the night before your surgery   You may drink clear liquids until 11:30 AM the morning of your surgery.   Clear liquids allowed are: Water, Non-Citrus Juices (without pulp), Carbonated Beverages, Clear Tea, Black Coffee Only (NO MILK, CREAM OR POWDERED CREAMER of any kind), and Gatorade.    Take these medicines the morning of surgery with A SIP OF WATER  amLODipine (NORVASC)  LIPITOR  methimazole (TAPAZOLE)  metoprolol succinate (TOPROL-XL)  omeprazole (PRILOSEC)   May take these medicines IF NEEDED: albuterol (VENTOLIN HFA)- bring inhaler with you on day of surgery predniSONE (DELTASONE)    Follow your surgeon's instructions on when to stop Aspirin.  If no instructions were given by your surgeon then you will need to call the office to get those instructions.     One week prior to surgery, STOP taking any Aleve, Naproxen, Ibuprofen, Motrin, Advil, Goody's, BC's, all herbal medications, fish oil, and non-prescription vitamins.  WHAT DO I DO ABOUT MY DIABETES MEDICATION?   Hold empagliflozin (JARDIANCE) for 72 hours. Last dose 9/21.    HOW TO MANAGE YOUR DIABETES BEFORE AND AFTER SURGERY  Why is it important to control my blood sugar before and after surgery? Improving blood sugar levels before and after surgery helps healing and can limit problems. A way of improving blood sugar control is eating a healthy diet by:  Eating less sugar and  carbohydrates  Increasing activity/exercise  Talking with your doctor about reaching your blood sugar goals High blood sugars (greater than 180 mg/dL) can raise your risk of infections and slow your recovery, so you will need to focus on controlling your diabetes during the weeks before surgery. Make sure that the doctor who takes care of your diabetes knows about your planned surgery including the date and location.  How do I manage my blood sugar before surgery? Check your blood sugar at least 4 times a day, starting 2 days before surgery, to make sure that the level is not too high or low.  Check your blood sugar the morning of your surgery when you wake up and every 2 hours until you get to the Short Stay unit.  If your blood sugar is less than 70 mg/dL, you will need to treat for low blood sugar: Do not take insulin. Treat a low blood sugar (less than 70 mg/dL) with  cup of clear juice (cranberry or apple), 4 glucose tablets, OR glucose gel. Recheck blood sugar in 15 minutes after treatment (to make sure it is greater than 70 mg/dL). If your blood sugar is not greater than 70 mg/dL on recheck, call [phone] for further instructions. Report your blood sugar to the short stay nurse when you get to Short Stay.  If you are admitted to the hospital after surgery: Your blood sugar will be checked by the staff and you will probably be given insulin after surgery (instead of oral diabetes medicines) to make sure you have good blood sugar levels. The  goal for blood sugar control after surgery is 80-180 mg/dL.                     Do NOT Smoke (Tobacco/Vaping) for 24 hours prior to your procedure.  If you use a CPAP at night, you may bring your mask/headgear for your overnight stay.   You will be asked to remove any contacts, glasses, piercing's, hearing aid's, dentures/partials prior to surgery. Please bring cases for these items if needed.    Patients discharged the day of surgery will  not be allowed to drive home, and someone needs to stay with them for 24 hours.  SURGICAL WAITING ROOM VISITATION Patients may have no more than 2 support people in the waiting area - these visitors may rotate.   Pre-op nurse will coordinate an appropriate time for 1 ADULT support person, who may not rotate, to accompany patient in pre-op.  Children under the age of 81 must have an adult with them who is not the patient and must remain in the main waiting area with an adult.  If the patient needs to stay at the hospital during part of their recovery, the visitor guidelines for inpatient rooms apply.  Please refer to the [facility] website for the visitor guidelines for any additional information.   If you received a COVID test during your pre-op visit  it is requested that you wear a mask when out in public, stay away from anyone that may not be feeling well and notify your surgeon if you develop symptoms. If you have been in contact with anyone that has tested positive in the last 10 days please notify you surgeon.      Pre-operative CHG Bathing Instructions   You can play a key role in reducing the risk of infection after surgery. Your skin needs to be as free of germs as possible. You can reduce the number of germs on your skin by washing with CHG (chlorhexidine gluconate) soap before surgery. CHG is an antiseptic soap that kills germs and continues to kill germs even after washing.   DO NOT use if you have an allergy to chlorhexidine/CHG or antibacterial soaps. If your skin becomes reddened or irritated, stop using the CHG and notify one of our RNs at [phone].              TAKE A SHOWER THE NIGHT BEFORE SURGERY AND THE DAY OF SURGERY    Please keep in mind the following:  DO NOT shave, including legs and underarms, 48 hours prior to surgery.   You may shave your face before/day of surgery.  Place clean sheets on your bed the night before surgery Use a clean washcloth (not used  since being washed) for each shower. DO NOT sleep with pet's night before surgery.  CHG Shower Instructions:  Wash your face and private area with normal soap. If you choose to wash your hair, wash first with your normal shampoo.  After you use shampoo/soap, rinse your hair and body thoroughly to remove shampoo/soap residue.  Turn the water OFF and apply half the bottle of CHG soap to a CLEAN washcloth.  Apply CHG soap ONLY FROM YOUR NECK DOWN TO YOUR TOES (washing for 3-5 minutes)  DO NOT use CHG soap on face, private areas, open wounds, or sores.  Pay special attention to the area where your surgery is being performed.  If you are having back surgery, having someone wash your back for you may be  helpful. Wait 2 minutes after CHG soap is applied, then you may rinse off the CHG soap.  Pat dry with a clean towel  Put on clean pajamas    Additional instructions for the day of surgery: DO NOT APPLY any lotions, deodorants, cologne, or perfumes.   Do not wear jewelry or makeup Do not wear nail polish, gel polish, artificial nails, or any other type of covering on natural nails (fingers and toes) Do not bring valuables to the hospital. [facility] is not responsible for valuables/personal belongings. Put on clean/comfortable clothes.  Please brush your teeth.  Ask your nurse before applying any prescription medications to the skin.

## 2023-01-06 ENCOUNTER — Other Ambulatory Visit: Payer: Self-pay

## 2023-01-06 ENCOUNTER — Encounter (HOSPITAL_COMMUNITY)
Admission: RE | Admit: 2023-01-06 | Discharge: 2023-01-06 | Disposition: A | Discharge status: Home / Self Care | Payer: 59 | Source: Ambulatory Visit | Attending: Otolaryngology

## 2023-01-06 ENCOUNTER — Encounter (HOSPITAL_COMMUNITY): Payer: Self-pay

## 2023-01-06 VITALS — BP 143/82 | HR 73 | Temp 98.4°F | Resp 18 | Ht 61.0 in | Wt 165.1 lb

## 2023-01-06 DIAGNOSIS — J449 Chronic obstructive pulmonary disease, unspecified: Secondary | ICD-10-CM | POA: Insufficient documentation

## 2023-01-06 DIAGNOSIS — B181 Chronic viral hepatitis B without delta-agent: Secondary | ICD-10-CM | POA: Insufficient documentation

## 2023-01-06 DIAGNOSIS — Z01812 Encounter for preprocedural laboratory examination: Secondary | ICD-10-CM | POA: Insufficient documentation

## 2023-01-06 DIAGNOSIS — I6523 Occlusion and stenosis of bilateral carotid arteries: Secondary | ICD-10-CM | POA: Insufficient documentation

## 2023-01-06 DIAGNOSIS — I10 Essential (primary) hypertension: Secondary | ICD-10-CM | POA: Diagnosis not present

## 2023-01-06 DIAGNOSIS — I2510 Atherosclerotic heart disease of native coronary artery without angina pectoris: Secondary | ICD-10-CM | POA: Diagnosis not present

## 2023-01-06 DIAGNOSIS — M314 Aortic arch syndrome [Takayasu]: Secondary | ICD-10-CM | POA: Diagnosis not present

## 2023-01-06 DIAGNOSIS — E119 Type 2 diabetes mellitus without complications: Secondary | ICD-10-CM | POA: Diagnosis not present

## 2023-01-06 DIAGNOSIS — C021 Malignant neoplasm of border of tongue: Secondary | ICD-10-CM | POA: Insufficient documentation

## 2023-01-06 DIAGNOSIS — K219 Gastro-esophageal reflux disease without esophagitis: Secondary | ICD-10-CM | POA: Diagnosis not present

## 2023-01-06 DIAGNOSIS — Z01818 Encounter for other preprocedural examination: Secondary | ICD-10-CM

## 2023-01-06 DIAGNOSIS — I700 Atherosclerosis of aorta: Secondary | ICD-10-CM | POA: Insufficient documentation

## 2023-01-06 HISTORY — DX: Gastro-esophageal reflux disease without esophagitis: K21.9

## 2023-01-06 LAB — CBC
HCT: 44.6 % (ref 36.0–46.0)
Hemoglobin: 14.3 g/dL (ref 12.0–15.0)
MCH: 26.8 pg (ref 26.0–34.0)
MCHC: 32.1 g/dL (ref 30.0–36.0)
MCV: 83.7 fL (ref 80.0–100.0)
Platelets: 340 10*3/uL (ref 150–400)
RBC: 5.33 MIL/uL — ABNORMAL HIGH (ref 3.87–5.11)
RDW: 13.3 % (ref 11.5–15.5)
WBC: 7.1 10*3/uL (ref 4.0–10.5)
nRBC: 0 % (ref 0.0–0.2)

## 2023-01-06 LAB — HEMOGLOBIN A1C
Hgb A1c MFr Bld: 6.8 % — ABNORMAL HIGH (ref 4.8–5.6)
Mean Plasma Glucose: 148.46 mg/dL

## 2023-01-06 LAB — COMPREHENSIVE METABOLIC PANEL
ALT: 17 U/L (ref 0–44)
AST: 18 U/L (ref 15–41)
Albumin: 4.1 g/dL (ref 3.5–5.0)
Alkaline Phosphatase: 116 U/L (ref 38–126)
Anion gap: 13 (ref 5–15)
BUN: 8 mg/dL (ref 6–20)
CO2: 25 mmol/L (ref 22–32)
Calcium: 9.2 mg/dL (ref 8.9–10.3)
Chloride: 101 mmol/L (ref 98–111)
Creatinine, Ser: 0.6 mg/dL (ref 0.44–1.00)
GFR, Estimated: >60 mL/min (ref >60)
Glucose, Bld: 128 mg/dL — ABNORMAL HIGH (ref 70–99)
Potassium: 2.9 mmol/L — ABNORMAL LOW (ref 3.5–5.1)
Sodium: 139 mmol/L (ref 135–145)
Total Bilirubin: 0.9 mg/dL (ref 0.3–1.2)
Total Protein: 8.1 g/dL (ref 6.5–8.1)

## 2023-01-06 LAB — GLUCOSE, CAPILLARY: Glucose-Capillary: 140 mg/dL — ABNORMAL HIGH (ref 70–99)

## 2023-01-06 NOTE — Progress Notes (Signed)
Anesthesia Chart Review:  52 year old female with pertinent history including HTN, diet-controlled DM2, hyperthyroidism on methimazole, COPD, GERD, left carotid artery occlusion secondary to Takayasu arteritis.    She has previously been evaluated by rheumatology for Takayasu arteritis and recommended to start DMARD, however she declined.  She has been managed on steroids. Recently evaluated at the ED in March 2024 abdominal pain.  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  Vascular surgery was consulted and did not recommend any acute intervention.  Recommend high-dose steroids and outpatient follow-up with rheumatology.  Rheumatology also commented stating, "52 year old female, history of Takayasu arteritis, not on treatment. Last seen by Dr. Herma Carson in 2020. She had recommended treatment in the past however patient did declined any steroid sparing agents. Presents with worsening neck pain, belching, abdominal pain. Labs and vital signs stable. CT scan shows progression of findings of her arteritis, including wall thickening left common carotid artery, worsened from 2014 thickening of the aortic arch, new superior mesenteric artery without occlusion. Given progression on active carotidynia, recommended p.o. prednisone 60 mg daily, taper down after 1 month, close follow-up with outpatient rheumatology for consideration of steroid sparing agents. I do recommend steroid sparing agents in addition to prednisone, but this must be determined in the outpatient setting with close follow-up and coordination with her rheumatologist. She can follow-up with Dr. Herma Carson in [facility] or with our group. ED provider will provide patient those options." patient has not had rheumatology follow-up.   Last seen by her PCP Dr. Johnn Hai on 12/26/2022, at that  time she was started on prednisone 5 mg daily for tachycardia osteoarthritis.  Recently found to have tongue cancer.  Cardiology evaluation in January 2023 with coronary CTA showing mild nonobstructive disease.  She was recommended to start statin therapy, she declined.  CTA chest PE 07/09/2022: 1. Suspected Takayasu arteritis with vascular wall thickening  causing chronic (at least since 2014) occlusion of the left common  carotid artery; continued severe and mildly worsened worsened (from  2014) stenosis of the right common carotid artery; wall thickening  in the aortic arch; and new (from 2016) wall thickening and  stranding around the superior mesenteric artery without occlusion or  thrombosis in the superior mesenteric artery.  2. Prominent stool throughout the colon favors constipation.  3. Trace free pelvic fluid.  4. Lumbar spondylosis and degenerative disc disease with suspected  mild right foraminal impingement at L3-4.  5. Aortic atherosclerosis.   Coronary CTA 04/30/2021: IMPRESSION: 1. Coronary calcium score of 51.3. This was 95 percentile for age and sex matched control.   2. Normal coronary origin with right dominance.   3. CAD-RADS 2. Mild non-obstructive CAD (25-49%) - RCA. Consider non-atherosclerotic causes of chest pain. Consider preventive therapy and risk factor modification.   4. Orifices of the RCA and LM are widely open without stenosis.   4. Heavy calcification of the aortae noted especially in the ascending aortae and the aortic arch which is related to Takayasu aortitis. No significant dilatation observed.

## 2023-01-06 NOTE — Progress Notes (Addendum)
PCP -  No PCP Cardiologist -  Denies  PPM/ICD - Denies Device Orders - n/a Rep Notified - n/a  Chest x-ray -  EKG - 07-09-22 ( requested tracing) Stress Test - 11-16-16 ECHO - 06-01-16 Cardiac Cath -   Sleep Study - denies CPAP -   Fasting Blood Sugar -  140-200 per patient. Blood sugar 140 at PAT appointment Checks Blood Sugar Per patient may two times a day and when she is "feeling bad" empagliflozin (JARDIANCE)  LAST dose 01-07-23  Last dose of GLP1 agonist-  denies GLP1 instructions: n/a  Blood Thinner Instructions: denies Aspirin Instructions: Last dose 01-05-23  ERAS Protcol - ERAS clear liquids until 11:30 PRE-SURGERY Ensure or G2-   COVID TEST- n/a   Anesthesia review: yes requested records. Hx of HTN, Asthma,( does states she can walk up a flight stairs with difficulty) DM.  Patient denies shortness of breath, fever, cough and chest pain at PAT appointment   All instructions explained to the patient, with a verbal understanding of the material. Patient agrees to go over the instructions while at home for a better understanding. Patient also instructed to self quarantine after being tested for COVID-19. The opportunity to ask questions was provided.

## 2023-01-09 NOTE — Anesthesia Preprocedure Evaluation (Signed)
Anesthesia Evaluation  Patient identified by MRN, date of birth, ID band Patient awake  General Assessment Comment:   Reviewed: Allergy & Precautions, H&P , NPO status , Patient's Chart, lab work & pertinent test results  History of Anesthesia Complications Negative for: history of anesthetic complications  Airway Mallampati: III  TM Distance: >3 FB Neck ROM: Full    Dental  (+) Dental Advisory Given   Pulmonary former smoker   breath sounds clear to auscultation       Cardiovascular hypertension, + Peripheral Vascular Disease   Rhythm:Regular + Carotid Bruit Suspected Takayasu arteritis with vascular wall thickening  causing chronic (at least since 2014) occlusion of the left common  carotid artery; continued severe and mildly worsened worsened (from  2014) stenosis of the right common carotid artery; wall thickening  in the aortic arch; and new (from 2016) wall thickening and  stranding around the superior mesenteric artery without occlusion or  thrombosis in the superior mesenteric artery.    T Soft tissue neck 11/30/22: IMPRESSION: 1. No mass lesion or abnormal enhancement identified to correspond to the reported squamous cell carcinoma of the tongue. 2. No pathologically enlarged or morphologically suspicious lymph nodes in the neck. 3. Highly stenotic or occluded bilateral common carotid arteries, described on a carotid Doppler study from 2014. In the absence of recent vascular imaging, recommend CTA head/neck for further evaluation.    Neuro/Psych   Anxiety Depression    negative neurological ROS     GI/Hepatic Neg liver ROS,GERD  ,,  Endo/Other  diabetes, Type 2 Hyperthyroidism   Renal/GU negative Renal ROS  negative genitourinary   Musculoskeletal negative musculoskeletal ROS (+)    Abdominal   Peds negative pediatric ROS (+)  Hematology negative hematology ROS (+)   Anesthesia Other Findings    Reproductive/Obstetrics negative OB ROS                             Anesthesia Physical Anesthesia Plan  ASA: 3  Anesthesia Plan: General   Post-op Pain Management: Minimal or no pain anticipated   Induction: Intravenous  PONV Risk Score and Plan: 3 and Ondansetron, Dexamethasone and Treatment may vary due to age or medical condition  Airway Management Planned: Oral ETT  Additional Equipment:   Intra-op Plan:   Post-operative Plan: Extubation in OR  Informed Consent: I have reviewed the patients History and Physical, chart, labs and discussed the procedure including the risks, benefits and alternatives for the proposed anesthesia with the patient or authorized representative who has indicated his/her understanding and acceptance.     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    She had prior cardiology evaluation in January 2023 with coronary CTA showing mild nonobstructive disease.  She was recommended to start statin therapy, she declined.  She has previously been evaluated by rheumatology for Takayasu arteritis and recommended to start DMARD, however she declined.  She has been managed on steroids. Recently evaluated at the ED in March 2024 abdominal pain.  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  Vascular surgery was consulted and did not recommend any acute  intervention.  Recommend high-dose steroids and outpatient follow-up with rheumatology.  Rheumatology also commented stating, "52 year old female, history of  Takayasu arteritis, not on treatment. Last seen by Dr. Herma Carson in 2020. She had recommended treatment in the past however patient did declined any steroid sparing agents. Presents with worsening neck pain, belching, abdominal pain. Labs and vital signs stable. CT scan shows progression of findings of her arteritis, including wall thickening left common carotid artery, worsened from 2014 thickening of the aortic arch, new superior mesenteric artery without occlusion. Given progression on active carotidynia, recommended p.o. prednisone 60 mg daily, taper down after 1 month, close follow-up with outpatient rheumatology for consideration of steroid sparing agents. I do recommend steroid sparing agents in addition to prednisone, but this must be determined in the outpatient setting with close follow-up and coordination with her rheumatologist. She can follow-up with Dr. Herma Carson in [facility] or with our group. ED provider will provide patient those options." patient has not had rheumatology follow-up.  Last seen by her PCP Dr. Johnn Hai on 12/26/2022, at that time she was started on prednisone 5 mg daily for Takayasu arteritis.  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  Reviewed history of Takayasu arteritis with anesthesiologist Dr. Ace Gins.  He advised that she is likely at higher risk for perioperative CVA, but there is no contraindication to proceeding in the setting of known cancer.   EKG 07/09/22 (care everywhere, tracing requested): Sinus rhythm. Rate 88.  Moderate voltage criteria for LVH.  Nonspecific ST and T wave abnormality.  CT Soft tissue neck 11/30/22: IMPRESSION: 1. No mass lesion or abnormal enhancement identified to correspond to the reported squamous cell carcinoma of the tongue. 2. No pathologically enlarged or morphologically suspicious lymph nodes in the neck. 3. Highly stenotic or occluded bilateral common carotid  arteries, described on a carotid Doppler study from 2014. In the absence of recent vascular imaging, recommend CTA head/neck for further evaluation.  CTA chest PE 07/09/2022: 1. Suspected Takayasu arteritis with vascular wall thickening  causing chronic (at least since 2014) occlusion of the left common  carotid artery; continued severe and mildly worsened worsened (from  2014) stenosis of the right common carotid artery; wall thickening  in the aortic arch; and new (from 2016) wall thickening and  stranding around the superior mesenteric artery without occlusion or  thrombosis in the superior mesenteric artery.  2. Prominent stool throughout the colon favors constipation.  3. Trace free pelvic fluid.  4. Lumbar spondylosis and degenerative disc disease with suspected  mild right foraminal impingement at L3-4.  5. Aortic atherosclerosis.   Coronary CTA 04/30/2021: IMPRESSION: 1. Coronary calcium score of 51.3. This was 95 percentile for age and sex matched control.   2. Normal coronary origin with right dominance.   3. CAD-RADS 2. Mild non-obstructive CAD (25-49%) - RCA. Consider non-atherosclerotic causes of chest pain. Consider preventive therapy and risk factor modification.   4. Orifices of the RCA and LM are widely open without stenosis.   4. Heavy calcification of the aortae noted especially in the ascending aortae and the aortic arch which is related to Takayasu aortitis. No significant dilatation observed.    )        Anesthesia Quick Evaluation

## 2023-01-11 ENCOUNTER — Other Ambulatory Visit (HOSPITAL_COMMUNITY): Payer: Self-pay

## 2023-01-11 ENCOUNTER — Encounter (HOSPITAL_COMMUNITY)
Admission: RE | Disposition: A | Discharge status: Home / Self Care | Payer: Self-pay | Source: Ambulatory Visit | Attending: Otolaryngology

## 2023-01-11 ENCOUNTER — Encounter (HOSPITAL_COMMUNITY): Payer: Self-pay | Admitting: Otolaryngology

## 2023-01-11 ENCOUNTER — Ambulatory Visit (HOSPITAL_COMMUNITY): Payer: 59 | Admitting: Physician Assistant

## 2023-01-11 ENCOUNTER — Other Ambulatory Visit: Payer: Self-pay

## 2023-01-11 ENCOUNTER — Ambulatory Visit (HOSPITAL_BASED_OUTPATIENT_CLINIC_OR_DEPARTMENT_OTHER): Payer: 59 | Admitting: Anesthesiology

## 2023-01-11 ENCOUNTER — Ambulatory Visit (HOSPITAL_COMMUNITY)
Admission: RE | Admit: 2023-01-11 | Discharge: 2023-01-11 | Disposition: A | Discharge status: Home / Self Care | Payer: 59 | Source: Ambulatory Visit | Attending: Otolaryngology | Admitting: Otolaryngology

## 2023-01-11 DIAGNOSIS — Z7984 Long term (current) use of oral hypoglycemic drugs: Secondary | ICD-10-CM | POA: Insufficient documentation

## 2023-01-11 DIAGNOSIS — Z87891 Personal history of nicotine dependence: Secondary | ICD-10-CM

## 2023-01-11 DIAGNOSIS — C021 Malignant neoplasm of border of tongue: Secondary | ICD-10-CM | POA: Insufficient documentation

## 2023-01-11 DIAGNOSIS — E119 Type 2 diabetes mellitus without complications: Secondary | ICD-10-CM | POA: Insufficient documentation

## 2023-01-11 DIAGNOSIS — I10 Essential (primary) hypertension: Secondary | ICD-10-CM | POA: Insufficient documentation

## 2023-01-11 DIAGNOSIS — C029 Malignant neoplasm of tongue, unspecified: Secondary | ICD-10-CM | POA: Diagnosis not present

## 2023-01-11 DIAGNOSIS — E782 Mixed hyperlipidemia: Secondary | ICD-10-CM | POA: Diagnosis not present

## 2023-01-11 LAB — POCT I-STAT, CHEM 8
BUN: 11 mg/dL (ref 6–20)
Calcium, Ion: 1.02 mmol/L — ABNORMAL LOW (ref 1.15–1.40)
Chloride: 105 mmol/L (ref 98–111)
Creatinine, Ser: 0.5 mg/dL (ref 0.44–1.00)
Glucose, Bld: 127 mg/dL — ABNORMAL HIGH (ref 70–99)
HCT: 42 % (ref 36.0–46.0)
Hemoglobin: 14.3 g/dL (ref 12.0–15.0)
Potassium: 4.2 mmol/L (ref 3.5–5.1)
Sodium: 139 mmol/L (ref 135–145)
TCO2: 26 mmol/L (ref 22–32)

## 2023-01-11 LAB — GLUCOSE, CAPILLARY
Glucose-Capillary: 126 mg/dL — ABNORMAL HIGH (ref 70–99)
Glucose-Capillary: 118 mg/dL — ABNORMAL HIGH (ref 70–99)
Glucose-Capillary: 164 mg/dL — ABNORMAL HIGH (ref 70–99)

## 2023-01-11 SURGERY — GLOSSECTOMY, PARTIAL
Anesthesia: General | Laterality: Right

## 2023-01-11 MED ORDER — PHENYLEPHRINE HCL-NACL 20-0.9 MG/250ML-% IV SOLN
INTRAVENOUS | Status: DC | PRN
  Administered 2023-01-11: 20 ug/min via INTRAVENOUS

## 2023-01-11 MED ORDER — HYDROCODONE-ACETAMINOPHEN 7.5-325 MG PO TABS
1.0000 | ORAL_TABLET | ORAL | 0 refills | Status: AC | PRN
Start: 2023-01-11 — End: 2023-01-16
  Filled 2023-01-11: qty 25, 5d supply, fill #0

## 2023-01-11 MED ORDER — LIDOCAINE-EPINEPHRINE 1 %-1:100000 IJ SOLN
INTRAMUSCULAR | Status: DC | PRN
  Administered 2023-01-11: 20 mL

## 2023-01-11 MED ORDER — PHENYLEPHRINE 80 MCG/ML (10ML) SYRINGE FOR IV PUSH (FOR BLOOD PRESSURE SUPPORT)
PREFILLED_SYRINGE | INTRAVENOUS | Status: DC | PRN
  Administered 2023-01-11: 160 ug via INTRAVENOUS
  Administered 2023-01-11: 100 ug via INTRAVENOUS

## 2023-01-11 MED ORDER — OXYCODONE HCL 5 MG/5ML PO SOLN: 5.0000 mg | Freq: Once | ORAL | Status: DC | PRN

## 2023-01-11 MED ORDER — LIDOCAINE-EPINEPHRINE 1 %-1:100000 IJ SOLN
INTRAMUSCULAR | Status: AC
  Filled 2023-01-11: qty 1

## 2023-01-11 MED ORDER — ROCURONIUM BROMIDE 10 MG/ML (PF) SYRINGE
PREFILLED_SYRINGE | INTRAVENOUS | Status: AC
  Filled 2023-01-11: qty 30

## 2023-01-11 MED ORDER — PROPOFOL 10 MG/ML IV BOLUS
INTRAVENOUS | Status: DC | PRN
  Administered 2023-01-11: 150 ug/kg/min via INTRAVENOUS
  Administered 2023-01-11: 150 mg via INTRAVENOUS
  Administered 2023-01-11: 50 mg via INTRAVENOUS

## 2023-01-11 MED ORDER — ROCURONIUM BROMIDE 10 MG/ML (PF) SYRINGE
PREFILLED_SYRINGE | INTRAVENOUS | Status: DC | PRN
  Administered 2023-01-11: 50 mg via INTRAVENOUS

## 2023-01-11 MED ORDER — MIDAZOLAM HCL 2 MG/2ML IJ SOLN
INTRAMUSCULAR | Status: DC | PRN
  Administered 2023-01-11: 2 mg via INTRAVENOUS

## 2023-01-11 MED ORDER — ORAL CARE MOUTH RINSE: 15.0000 mL | Freq: Once | OROMUCOSAL | Status: AC

## 2023-01-11 MED ORDER — SUCCINYLCHOLINE CHLORIDE 200 MG/10ML IV SOSY
PREFILLED_SYRINGE | INTRAVENOUS | Status: AC
  Filled 2023-01-11: qty 20

## 2023-01-11 MED ORDER — ONDANSETRON HCL 4 MG/2ML IJ SOLN
INTRAMUSCULAR | Status: AC
  Filled 2023-01-11: qty 4

## 2023-01-11 MED ORDER — CHLORHEXIDINE GLUCONATE 0.12 % MT SOLN
OROMUCOSAL | Status: AC
  Filled 2023-01-11: qty 15

## 2023-01-11 MED ORDER — PHENYLEPHRINE 80 MCG/ML (10ML) SYRINGE FOR IV PUSH (FOR BLOOD PRESSURE SUPPORT)
PREFILLED_SYRINGE | INTRAVENOUS | Status: AC
  Filled 2023-01-11: qty 30

## 2023-01-11 MED ORDER — OXYCODONE HCL 5 MG PO TABS: 5.0000 mg | ORAL_TABLET | Freq: Once | ORAL | Status: DC | PRN

## 2023-01-11 MED ORDER — ONDANSETRON HCL 4 MG/2ML IJ SOLN
INTRAMUSCULAR | Status: DC | PRN
  Administered 2023-01-11: 4 mg via INTRAVENOUS

## 2023-01-11 MED ORDER — ONDANSETRON HCL 4 MG/2ML IJ SOLN: 4.0000 mg | Freq: Once | INTRAMUSCULAR | Status: DC | PRN

## 2023-01-11 MED ORDER — MIDAZOLAM HCL 2 MG/2ML IJ SOLN
INTRAMUSCULAR | Status: AC
  Filled 2023-01-11: qty 2

## 2023-01-11 MED ORDER — CHLORHEXIDINE GLUCONATE 0.12 % MT SOLN
15.0000 mL | Freq: Once | OROMUCOSAL | Status: AC
  Administered 2023-01-11: 15 mL via OROMUCOSAL

## 2023-01-11 MED ORDER — DEXAMETHASONE SODIUM PHOSPHATE 10 MG/ML IJ SOLN
INTRAMUSCULAR | Status: DC | PRN
  Administered 2023-01-11: 5 mg via INTRAVENOUS

## 2023-01-11 MED ORDER — SUGAMMADEX SODIUM 200 MG/2ML IV SOLN
INTRAVENOUS | Status: DC | PRN
  Administered 2023-01-11: 200 mg via INTRAVENOUS

## 2023-01-11 MED ORDER — INSULIN ASPART 100 UNIT/ML IJ SOLN: 0.0000 [IU] | INTRAMUSCULAR | Status: DC | PRN

## 2023-01-11 MED ORDER — HYDROMORPHONE HCL 1 MG/ML IJ SOLN: 0.2500 mg | INTRAMUSCULAR | Status: DC | PRN

## 2023-01-11 MED ORDER — LIDOCAINE 2% (20 MG/ML) 5 ML SYRINGE
INTRAMUSCULAR | Status: DC | PRN
  Administered 2023-01-11: 40 mg via INTRAVENOUS

## 2023-01-11 MED ORDER — FENTANYL CITRATE (PF) 250 MCG/5ML IJ SOLN
INTRAMUSCULAR | Status: AC
  Filled 2023-01-11: qty 5

## 2023-01-11 MED ORDER — CEFAZOLIN SODIUM-DEXTROSE 2-4 GM/100ML-% IV SOLN
INTRAVENOUS | Status: AC
  Filled 2023-01-11: qty 100

## 2023-01-11 MED ORDER — FENTANYL CITRATE (PF) 250 MCG/5ML IJ SOLN
INTRAMUSCULAR | Status: DC | PRN
  Administered 2023-01-11 (×2): 50 ug via INTRAVENOUS

## 2023-01-11 MED ORDER — CEFAZOLIN SODIUM-DEXTROSE 2-4 GM/100ML-% IV SOLN
2.0000 g | INTRAVENOUS | Status: AC
  Administered 2023-01-11: 2 g via INTRAVENOUS

## 2023-01-11 MED ORDER — SODIUM CHLORIDE 0.9 % IV SOLN: INTRAVENOUS | Status: DC

## 2023-01-11 SURGICAL SUPPLY — 52 items
APPLIER CLIP 9.375 MED OPEN (MISCELLANEOUS)
APR CLP MED 9.3 20 MLT OPN (MISCELLANEOUS)
BAG COUNTER SPONGE SURGICOUNT (BAG) ×1 IMPLANT
BAG SPNG CNTER NS LX DISP (BAG) ×1
BLADE SURG 15 STRL LF DISP TIS (BLADE) IMPLANT
BLADE SURG 15 STRL SS (BLADE)
CANISTER SUCT 3000ML PPV (MISCELLANEOUS) ×1 IMPLANT
CLEANER TIP ELECTROSURG 2X2 (MISCELLANEOUS) ×1 IMPLANT
CLIP APPLIE 9.375 MED OPEN (MISCELLANEOUS) IMPLANT
CNTNR URN SCR LID CUP LEK RST (MISCELLANEOUS) ×1 IMPLANT
CONT SPEC 4OZ STRL OR WHT (MISCELLANEOUS) ×1
CORD BIPOLAR FORCEPS 12FT (ELECTRODE) ×1 IMPLANT
COVER SURGICAL LIGHT HANDLE (MISCELLANEOUS) ×1 IMPLANT
ELECT COATED BLADE 2.86 ST (ELECTRODE) IMPLANT
ELECT NDL BLADE 2-5/6 (NEEDLE) ×1 IMPLANT
ELECT NEEDLE BLADE 2-5/6 (NEEDLE) ×2
ELECT REM PT RETURN 9FT ADLT (ELECTROSURGICAL) ×1
ELECTRODE REM PT RTRN 9FT ADLT (ELECTROSURGICAL) ×1 IMPLANT
FORCEPS BIPOLAR SPETZLER 8 1.0 (NEUROSURGERY SUPPLIES) ×1 IMPLANT
GAUZE 4X4 16PLY ~~LOC~~+RFID DBL (SPONGE) ×1 IMPLANT
GAUZE SPONGE 4X4 12PLY STRL (GAUZE/BANDAGES/DRESSINGS) IMPLANT
GLOVE ECLIPSE 7.5 STRL STRAW (GLOVE) ×1 IMPLANT
GLOVE SURG LTX SZ6.5 (GLOVE) ×1 IMPLANT
GOWN STRL REUS W/ TWL LRG LVL3 (GOWN DISPOSABLE) ×2 IMPLANT
GOWN STRL REUS W/TWL LRG LVL3 (GOWN DISPOSABLE) ×2
KIT BASIN OR (CUSTOM PROCEDURE TRAY) ×1 IMPLANT
KIT MARKER MARGIN INK (KITS) IMPLANT
KIT TURNOVER KIT B (KITS) ×1 IMPLANT
LOCATOR NERVE 3 VOLT (DISPOSABLE) IMPLANT
NDL HYPO 25GX1X1/2 BEV (NEEDLE) ×1 IMPLANT
NEEDLE HYPO 25GX1X1/2 BEV (NEEDLE) ×1
NS IRRIG 1000ML POUR BTL (IV SOLUTION) ×1 IMPLANT
PAD ARMBOARD 7.5X6 YLW CONV (MISCELLANEOUS) ×1 IMPLANT
POSITIONER HEAD DONUT 9IN (MISCELLANEOUS) ×1 IMPLANT
SPONGE INTESTINAL PEANUT (DISPOSABLE) IMPLANT
SPONGE T-LAP 18X18 ~~LOC~~+RFID (SPONGE) ×1 IMPLANT
SURGILUBE 2OZ TUBE FLIPTOP (MISCELLANEOUS) IMPLANT
SUT CHROMIC 3 0 PS 2 (SUTURE) ×3 IMPLANT
SUT CHROMIC 4 0 P 3 18 (SUTURE) IMPLANT
SUT MNCRL AB 4-0 PS2 18 (SUTURE) ×1 IMPLANT
SUT SILK 2 0 PERMA HAND 18 BK (SUTURE) ×1 IMPLANT
SUT SILK 3 0 REEL (SUTURE) IMPLANT
SUT SILK 3 0 SH 30 (SUTURE) IMPLANT
SUT SILK 3 0 SH CR/8 (SUTURE) IMPLANT
SUT SILK 4 0 REEL (SUTURE) IMPLANT
SUT VIC AB 3-0 SH 27 (SUTURE)
SUT VIC AB 3-0 SH 27XBRD (SUTURE) IMPLANT
SUT VIC AB 4-0 PS2 27 (SUTURE) IMPLANT
SUT VIC AB 4-0 RB1 18 (SUTURE) ×1 IMPLANT
TOWEL GREEN STERILE FF (TOWEL DISPOSABLE) ×1 IMPLANT
TRAY ENT MC OR (CUSTOM PROCEDURE TRAY) ×1 IMPLANT
WATER STERILE IRR 1000ML POUR (IV SOLUTION) ×1 IMPLANT

## 2023-01-11 NOTE — Anesthesia Procedure Notes (Signed)
Procedure Name: Intubation Date/Time: 01/11/2023 3:04 PM  Performed by: Samara Deist, CRNAPre-anesthesia Checklist: Patient identified, Emergency Drugs available, Suction available and Patient being monitored Patient Re-evaluated:Patient Re-evaluated prior to induction Oxygen Delivery Method: Circle system utilized Preoxygenation: Pre-oxygenation with 100% oxygen Induction Type: IV induction Ventilation: Mask ventilation without difficulty Laryngoscope Size: Mac and 3 Grade View: Grade II Tube type: Oral Tube size: 7.0 mm Number of attempts: 1 Airway Equipment and Method: Stylet and Oral airway Placement Confirmation: ETT inserted through vocal cords under direct vision, positive ETCO2 and breath sounds checked- equal and bilateral Secured at: 24 cm Tube secured with: Tape Dental Injury: Teeth and Oropharynx as per pre-operative assessment  Comments: Intubated by SRNA C. Holmes under supervision of MD Sampson Goon

## 2023-01-11 NOTE — H&P (Signed)
Molly Hardin is an 52 y.o. female.    Chief Complaint:  Squamous Cell carcinoma of oral cavity  HPI: Patient presents today for planned elective procedure.  She denies any interval change in history since office visit on 11/23/2022. She denies pain or swelling of tongue.  Past Medical History:  Diagnosis Date   Abdominal pain 07/22/2015   Amenorrhea 07/22/2015   Anxiety    Aortic arch syndrome (takayasu) (HCC) 04/22/2013   Asthma    Carotid bruit 07/22/2015   Chest pain 11/15/2016   Chronic hepatitis B (HCC) 07/15/2004   Chronic midline thoracic back pain 05/23/2016   Depression    Diabetes mellitus without complication (HCC)    Drug therapy 10/19/2015   Dysphagia 07/22/2015   Essential hypertension 07/22/2015   Fibromyalgia 07/22/2015   Functional GI complaint 08/20/2019   Gastro-esophageal reflux disease with esophagitis 07/22/2015   GERD (gastroesophageal reflux disease)    Gestational diabetes mellitus 07/22/2015   HBV (hepatitis B virus) infection 07/15/2004   Heartburn 10/19/2015   Hyperlipidemia 11/15/2016   Hypermetropia of both eyes 10/13/2017   Hypertension    Hyperthyroidism 05/04/2017   Hypokalemia 07/22/2015   Keratoconjunctivitis sicca of both eyes not specified as Sjogren's 10/13/2017   Left carotid artery occlusion    Lower back pain 07/22/2015   LVH (left ventricular hypertrophy) 03/2016   [facility]   Malignant neoplasm of cervix uteri, unspecified (HCC) 11/08/2011   Mixed hyperlipidemia 11/15/2016   Takayasu's arteritis (HCC)    Thyroid disease    Hyperthyroid   Type 2 diabetes mellitus without complication, without long-term current use of insulin (HCC) 11/15/2016   Vitreous floaters of both eyes 10/13/2017    Past Surgical History:  Procedure Laterality Date   HEMORRHOID SURGERY      Family History  Problem Relation Age of Onset   Diabetes Mother    Hypertension Mother    Arthritis Mother    Diabetes Father     Hypertension Father    Hypertension Sister    Hypertension Brother    Hypertension Sister    Hypertension Sister    Hypertension Sister    Hypertension Brother     Social History:  reports that she quit smoking about 12 years ago. Her smoking use included cigarettes. She has never used smokeless tobacco. She reports that she does not drink alcohol and does not use drugs.  Allergies:  Allergies  Allergen Reactions   Pepcid Complete [Famotidine-Ca Carb-Mag Hydrox] Swelling   Famotidine Swelling   Colchicine Palpitations    Medications Prior to Admission  Medication Sig Dispense Refill   amLODipine (NORVASC) 5 MG tablet Take 5 mg by mouth daily.     aspirin EC 81 MG tablet Take 81 mg by mouth daily. Swallow whole.     cholecalciferol (VITAMIN D3) 25 MCG (1000 UNIT) tablet Take 1,000 Units by mouth daily.     empagliflozin (JARDIANCE) 25 MG TABS tablet Take 25 mg by mouth daily.     LIPITOR 10 MG tablet Take 10 mg by mouth daily.     losartan (COZAAR) 50 MG tablet Take 50 mg by mouth daily.     methimazole (TAPAZOLE) 10 MG tablet Take 20 mg by mouth daily.     metoprolol succinate (TOPROL-XL) 25 MG 24 hr tablet Take 50 mg by mouth daily.     Omega 3 1200 MG CAPS Take 1,200 mg by mouth daily.     omeprazole (PRILOSEC) 40 MG capsule Take 40 mg by mouth daily.  potassium chloride SA (KLOR-CON M) 20 MEQ tablet Take 20 mEq by mouth daily.     predniSONE (DELTASONE) 5 MG tablet Take 5 mg by mouth daily as needed (inflammation).     albuterol (VENTOLIN HFA) 108 (90 Base) MCG/ACT inhaler Inhale 2 puffs into the lungs every 6 (six) hours as needed for wheezing or shortness of breath.     CONTOUR NEXT TEST test strip USE 1 STRIP TO CHECK GLUCOSE ONCE DAILY      Results for orders placed or performed during the hospital encounter of 01/11/23 (from the past 48 hour(s))  Glucose, capillary     Status: Abnormal   Collection Time: 01/11/23 12:11 PM  Result Value Ref Range   Glucose-Capillary  126 (H) 70 - 99 mg/dL    Comment: Glucose reference range applies only to samples taken after fasting for at least 8 hours.  I-STAT, chem 8     Status: Abnormal   Collection Time: 01/11/23 12:45 PM  Result Value Ref Range   Sodium 139 135 - 145 mmol/L   Potassium 4.2 3.5 - 5.1 mmol/L   Chloride 105 98 - 111 mmol/L   BUN 11 6 - 20 mg/dL   Creatinine, Ser 2.37 0.44 - 1.00 mg/dL   Glucose, Bld 628 (H) 70 - 99 mg/dL    Comment: Glucose reference range applies only to samples taken after fasting for at least 8 hours.   Calcium, Ion 1.02 (L) 1.15 - 1.40 mmol/L   TCO2 26 22 - 32 mmol/L   Hemoglobin 14.3 12.0 - 15.0 g/dL   HCT 31.5 17.6 - 16.0 %  Glucose, capillary     Status: Abnormal   Collection Time: 01/11/23  2:20 PM  Result Value Ref Range   Glucose-Capillary 118 (H) 70 - 99 mg/dL    Comment: Glucose reference range applies only to samples taken after fasting for at least 8 hours.   No results found.  ROS: ROS  Blood pressure (!) 150/96, pulse 82, temperature 98.4 F (36.9 C), temperature source Oral, resp. rate 18, height 5\' 1"  (1.549 m), weight 74.8 kg, SpO2 100%.  PHYSICAL EXAM: Physical Exam Constitutional:      Appearance: Normal appearance.  Pulmonary:     Effort: Pulmonary effort is normal.  Neurological:     General: No focal deficit present.     Mental Status: She is alert. Mental status is at baseline.     Studies Reviewed: Surgical pathology reviewed   Assessment/Plan Molly Hardin is a 52 y.o. female with: squamous cell carcinoma of the oral tongue on recent in office biopsy. To OR today for right partial glossectomy with frozen section. Risks, recovery reviewed. All questions answered.     Molly Hardin A [name] 01/11/2023, 2:31 PM

## 2023-01-11 NOTE — Transfer of Care (Signed)
Immediate Anesthesia Transfer of Care Note  Patient: Molly Hardin  Procedure(s) Performed: PARTIAL GLOSSECTOMY WITH FROZEN SECTION PATHOLOGY (Right)  Patient Location: PACU  Anesthesia Type:General  Level of Consciousness: awake, alert , and patient cooperative  Airway & Oxygen Therapy: Patient Spontanous Breathing  Post-op Assessment: Report given to RN and Post -op Vital signs reviewed and stable  Post vital signs: Reviewed and stable  Last Vitals:  Vitals Value Taken Time  BP 129/79 01/11/23 1610  Temp    Pulse 85 01/11/23 1612  Resp 11 01/11/23 1612  SpO2 96 % 01/11/23 1612  Vitals shown include unfiled device data.  Last Pain:  Vitals:   01/11/23 1259  TempSrc:   PainSc: 0-No pain      Patients Stated Pain Goal: 0 (01/11/23 1259)  Complications: No notable events documented.

## 2023-01-11 NOTE — Op Note (Signed)
OPERATIVE NOTE  Molly Hardin Date/Time of Admission: 01/11/2023 11:56 AM  CSN: [id number];FIE:[id number] Attending Provider: Cheron Schaumann A, DO Room/Bed: MCPO/NONE DOB: Oct 30, 1970 Age: 52 y.o.   Pre-Op Diagnosis: Squamous cell carcinoma of right lateral tongue  Post-Op Diagnosis: Squamous cell carcinoma of right lateral tongue  Procedure: Procedure(s): RIGHT PARTIAL GLOSSECTOMY WITH FROZEN SECTION PATHOLOGY  Anesthesia: General  Surgeon(s): Kaleb Sek A Nica Friske, DO  Staff: Circulator: Rogers Seeds, RN Scrub Person: Carmela Rima Circulator Assistant: Ermalinda Barrios, RN; Rhys Martini, RN RN First Assistant: Jeronimo Greaves, RN  Implants: * No implants in log *  Specimens: ID Type Source Tests Collected by Time Destination  1 : Right lateral tongue region short stitch superior, long stitch anterior, ink deep margin Tissue Path Tissue SURGICAL PATHOLOGY Larya Charpentier A, DO 01/11/2023 1503     Complications: None  EBL: <5 ML  Condition: stable  Operative Findings:  Approximately 1cm firm, ulcerated lesion of the right lateral tongue. No palpable deep extension.   Frozen section demonstrated Squamous Cell Carcinoma with negative margins, and no evidence of close surgical margin  Description of Operation: Once the operative consent was obtained and the site and surgery were confirmed with the patient and the operating room team, the patient was brought back to the operating room and there was smooth induction of general nasotracheal anesthesia.  She was prepped and draped in clean fashion and attention was turned to the oral cavity.  A Cordie Grice was utilized to allow visualization of the oral cavity.  A 2-0 silk suture was placed at the tongue tip to allow gentle tongue retraction.  The lesion was identified and a circumferential 1cm margin was marked, with total planned resection measuring 3.5 cm x 3cm. The mucosa was excised, and superior and  anterior margins were marked using a 3-0 silk suture. The lesion was then completely resected and sent to pathology for frozen section. Frozen section confirmed presence of squamous cell carcinoma, and all margins were reported to be negative for malignancy.  The wound was copiously irrigated and the defect was closed in a layered fashion, using 4-0 Vicryl to reapproximate the muscle and 4-0 chromic suture in a running locking fashion to approximate the mucosa. An orogastric tube was passed to reduce postoperative nausea and the patient was returned to the care of anesthesia.   Laren Boom, DO [facility]  01/11/2023

## 2023-01-12 NOTE — Anesthesia Postprocedure Evaluation (Signed)
Anesthesia Post Note  Patient: Molly Hardin  Procedure(s) Performed: PARTIAL GLOSSECTOMY WITH FROZEN SECTION PATHOLOGY (Right)     Patient location during evaluation: PACU Anesthesia Type: General Level of consciousness: awake and alert Pain management: pain level controlled Vital Signs Assessment: post-procedure vital signs reviewed and stable Respiratory status: spontaneous breathing, nonlabored ventilation, respiratory function stable and patient connected to nasal cannula oxygen Cardiovascular status: blood pressure returned to baseline and stable Postop Assessment: no apparent nausea or vomiting Anesthetic complications: no   No notable events documented.  Last Vitals:  Vitals:   01/11/23 1630 01/11/23 1641  BP: 129/66 116/83  Pulse: 90 86  Resp: 18 12  Temp:  36.7 C  SpO2: 95% 97%    Last Pain:  Vitals:   01/11/23 1630  TempSrc:   PainSc: 0-No pain                 Kennieth Rad

## 2023-01-16 LAB — SURGICAL PATHOLOGY

## 2023-01-18 DIAGNOSIS — C021 Malignant neoplasm of border of tongue: Secondary | ICD-10-CM | POA: Diagnosis not present

## 2023-01-18 DIAGNOSIS — I6523 Occlusion and stenosis of bilateral carotid arteries: Secondary | ICD-10-CM | POA: Diagnosis not present

## 2023-03-22 DIAGNOSIS — K640 First degree hemorrhoids: Secondary | ICD-10-CM | POA: Diagnosis not present

## 2023-03-22 DIAGNOSIS — K635 Polyp of colon: Secondary | ICD-10-CM | POA: Diagnosis not present

## 2023-03-22 DIAGNOSIS — K621 Rectal polyp: Secondary | ICD-10-CM | POA: Diagnosis not present

## 2023-03-22 DIAGNOSIS — Z860100 Personal history of colon polyps, unspecified: Secondary | ICD-10-CM | POA: Diagnosis not present

## 2023-03-22 DIAGNOSIS — Z1211 Encounter for screening for malignant neoplasm of colon: Secondary | ICD-10-CM | POA: Diagnosis not present

## 2023-03-22 DIAGNOSIS — K5730 Diverticulosis of large intestine without perforation or abscess without bleeding: Secondary | ICD-10-CM | POA: Diagnosis not present

## 2023-03-22 DIAGNOSIS — D123 Benign neoplasm of transverse colon: Secondary | ICD-10-CM | POA: Diagnosis not present

## 2023-04-20 DIAGNOSIS — I6523 Occlusion and stenosis of bilateral carotid arteries: Secondary | ICD-10-CM | POA: Diagnosis not present

## 2023-04-20 DIAGNOSIS — C021 Malignant neoplasm of border of tongue: Secondary | ICD-10-CM | POA: Diagnosis not present

## 2023-05-05 ENCOUNTER — Other Ambulatory Visit: Payer: Self-pay

## 2023-05-05 DIAGNOSIS — I6522 Occlusion and stenosis of left carotid artery: Secondary | ICD-10-CM

## 2023-05-12 ENCOUNTER — Encounter: Payer: Medicare Other | Admitting: Vascular Surgery

## 2023-05-12 ENCOUNTER — Ambulatory Visit (HOSPITAL_COMMUNITY): Payer: Medicare Other

## 2023-07-14 ENCOUNTER — Ambulatory Visit (HOSPITAL_COMMUNITY): Payer: Medicare Other

## 2023-07-14 ENCOUNTER — Encounter: Payer: Medicare Other | Admitting: Vascular Surgery

## 2023-09-10 IMAGING — CT CT HEART MORP W/ CTA COR W/ SCORE W/ CA W/CM &/OR W/O CM
3 of 9 series · 6 of 20 positions shown, 7 images · IV contrast (APPLIED)
Comparison: None.
COMPARISON: None.

Addendum:
EXAM:
OVER-READ INTERPRETATION  CT CHEST

The following report is an over-read performed by radiologist Dr.
Dicky Van Weng Ngen [[email or web address]] on 04/30/2021. This
over-read does not include interpretation of cardiac or coronary
anatomy or pathology. The coronary calcium score/coronary CTA
interpretation by the cardiologist is attached.
CLINICAL DATA: CP, known Ohtmane disease.
Cardiac/Coronary  CTA
TECHNIQUE: The patient was scanned on a Phillips Force scanner.

[Series 7: best syst 43 % · axial · 0.40mm/px · z∈[-252,-208]mm · 2 of 331 slices shown, 3 images]
[im 111/331  vessel]
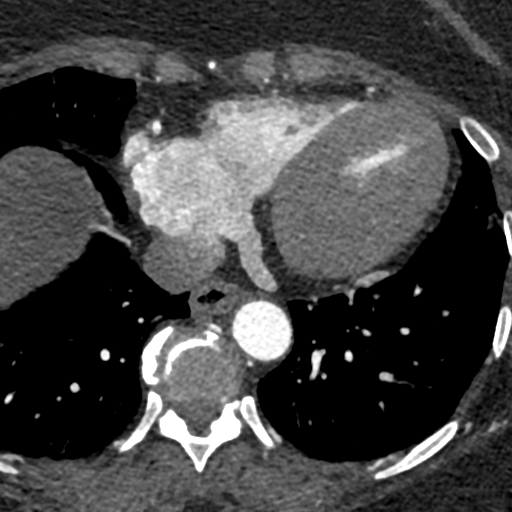
[im 111/331  lung]
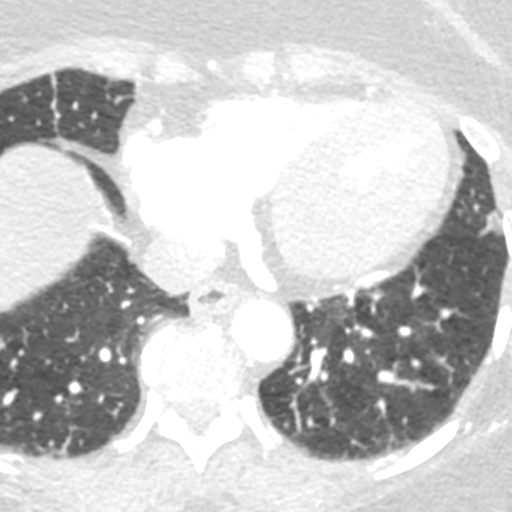
[im 221/331  vessel]
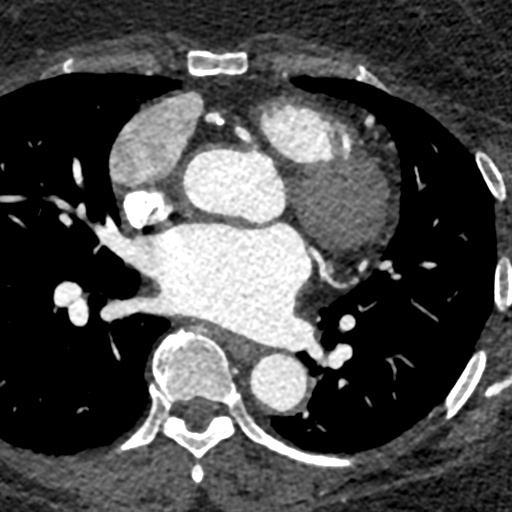

[Series 8: ts diast sharp 75 % · axial · 0.40mm/px · z∈[-252,-208]mm · 2 of 331 slices shown]
[im 111/331  lung]
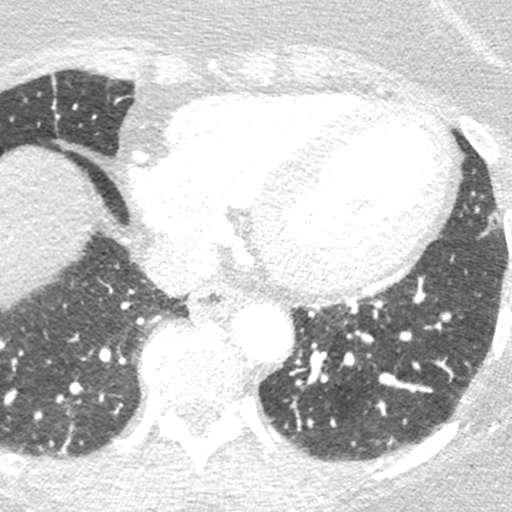
[im 221/331  lung]
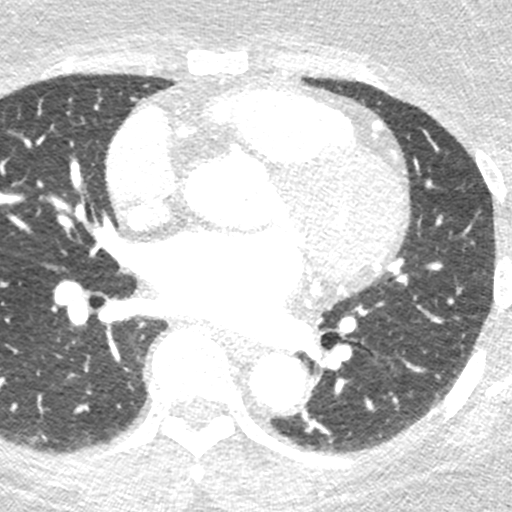

[Series 9: ts syst sharp 43 % · axial · 0.40mm/px · z∈[-252,-208]mm · 2 of 331 slices shown]
[im 111/331  lung]
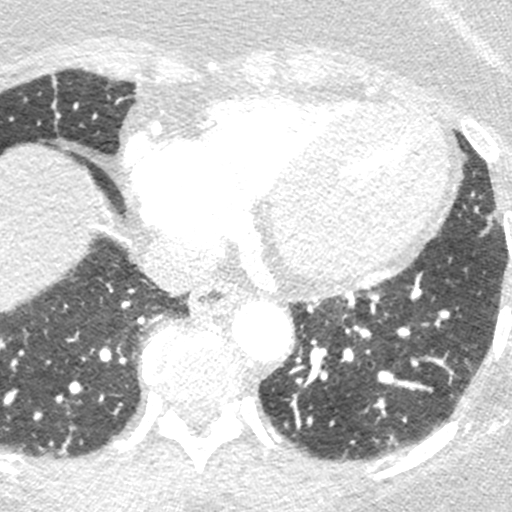
[im 221/331  lung]
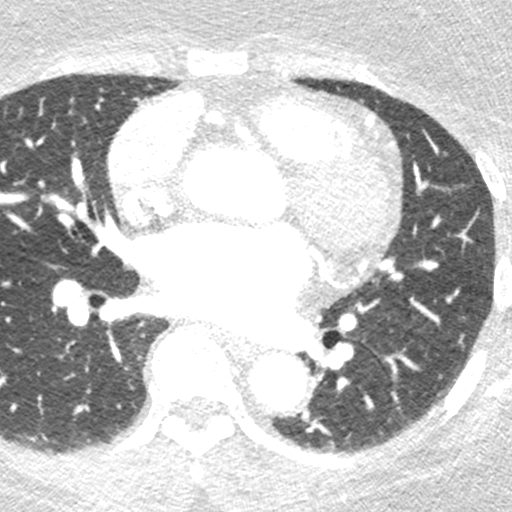

[6 of 20 positions shown; findings below may reference images not displayed]

FINDINGS: Atherosclerotic calcifications are noted in the thoracic aorta.
Within the visualized portions of the thorax there are no suspicious
appearing pulmonary nodules or masses, there is no acute
consolidative airspace disease, no pleural effusions, no
pneumothorax and no lymphadenopathy. Visualized portions of the
upper abdomen demonstrates diffuse low attenuation throughout the
visualized hepatic parenchyma, indicative of hepatic steatosis.
There are no aggressive appearing lytic or blastic lesions noted in
the visualized portions of the skeleton.
IMPRESSION: 1.  Aortic Atherosclerosis (R9GX1-PUJ.J).
2. Hepatic steatosis.
FINDINGS: A 100 kV prospective scan was triggered in the descending thoracic
aorta at 111 HU's. Axial non-contrast 3 mm slices were carried out
through the heart. The data set was analyzed on a dedicated work
station and scored using the Agatson method. Gantry rotation speed
was 250 msecs and collimation was .6 mm. No beta blockade and 0.8 mg
of sl NTG was given. The 3D data set was reconstructed in 5%
intervals of the 67-82 % of the R-R cycle. Diastolic phases were
analyzed on a dedicated work station using MPR, MIP and VRT modes.
The patient received 80 cc of contrast.

Aorta: Normal size. Heavy calcifications noted especially in the
ascending aortae and the aortic arch. No dissection.

Aortic Valve:  Trileaflet.  No calcifications.

Coronary Arteries:  Normal coronary origin.  Right dominance.

RCA is a large dominant artery that gives rise to PDA and PLA.
Orifice of this artery has normal caliber, without obstruction. In
the proximal portion of this artery there is calcified plaque with
minimal (0-25%) stenosis. Mid portion of this artery has mild soft
plaque with 25-49% stenosis.

Left main is a large artery that gives rise to LAD and LCX arteries.
Orifice of this artery has normal caliber, without obstruction.

LAD is a large vessel that has no significant plaque. Minimal
irregularities of less than 25% obstruction are noted. This artery
has very tortuous course.

LCX is a non-dominant artery that gives rise to one large, tortuous
OM1 branch. There is no plaque.

Other findings:

Normal pulmonary vein drainage into the left atrium.

Normal left atrial appendage without a thrombus.

Normal size of the pulmonary artery.
IMPRESSION: 1. Coronary calcium score of 51.3. This was 95 percentile for age
and sex matched control.

2. Normal coronary origin with right dominance.

3. CAD-RADS 2. Mild non-obstructive CAD (25-49%) - RCA. Consider
non-atherosclerotic causes of chest pain. Consider preventive
therapy and risk factor modification.

4. Orifices of the RCA and LM are widely open without stenosis.

4. Heavy calcification of the aortae noted especially in the
ascending aortae and the aortic arch which is related to Ohtmane
aortitis. No significant dilatation observed.

*** End of Addendum ***
EXAM:
OVER-READ INTERPRETATION  CT CHEST

The following report is an over-read performed by radiologist Dr.
Dicky Van Weng Ngen [[email or web address]] on 04/30/2021. This
over-read does not include interpretation of cardiac or coronary
anatomy or pathology. The coronary calcium score/coronary CTA
interpretation by the cardiologist is attached.
FINDINGS: Atherosclerotic calcifications are noted in the thoracic aorta.
Within the visualized portions of the thorax there are no suspicious
appearing pulmonary nodules or masses, there is no acute
consolidative airspace disease, no pleural effusions, no
pneumothorax and no lymphadenopathy. Visualized portions of the
upper abdomen demonstrates diffuse low attenuation throughout the
visualized hepatic parenchyma, indicative of hepatic steatosis.
There are no aggressive appearing lytic or blastic lesions noted in
the visualized portions of the skeleton.
IMPRESSION: 1.  Aortic Atherosclerosis (R9GX1-PUJ.J).
2. Hepatic steatosis.

## 2023-09-20 NOTE — Progress Notes (Unsigned)
 Patient ID: Molly Hardin, female   DOB: 06-24-1970, 53 y.o.   MRN: [id number]  Reason for Consult: No chief complaint on file.   Referred by Skotnicki, Meghan A, DO  Subjective:     HPI Molly Hardin is a 53 y.o. female presenting for carotid artery stenosis.  She has a history of spinal cell carcinoma of the tongue and recently underwent a partial glossectomy in September 2024.  Preoperative imaging noted incidental finding of severe stenosis of bilateral common carotid arteries.  A duplex in 2014 also noted a severe stenosis and possible occlusion of bilateral common carotid arteries.  She denies any previous history of strokes.  She denies any current strokelike symptoms.  Specifically denies any one-sided weakness, numbness, or amaurosis or speech issues.  Past Medical History:  Diagnosis Date   Abdominal pain 07/22/2015   Amenorrhea 07/22/2015   Anxiety    Aortic arch syndrome (takayasu) (HCC) 04/22/2013   Asthma    Carotid bruit 07/22/2015   Chest pain 11/15/2016   Chronic hepatitis B (HCC) 07/15/2004   Chronic midline thoracic back pain 05/23/2016   Depression    Diabetes mellitus without complication (HCC)    Drug therapy 10/19/2015   Dysphagia 07/22/2015   Essential hypertension 07/22/2015   Fibromyalgia 07/22/2015   Functional GI complaint 08/20/2019   Gastro-esophageal reflux disease with esophagitis 07/22/2015   GERD (gastroesophageal reflux disease)    Gestational diabetes mellitus 07/22/2015   HBV (hepatitis B virus) infection 07/15/2004   Heartburn 10/19/2015   Hyperlipidemia 11/15/2016   Hypermetropia of both eyes 10/13/2017   Hypertension    Hyperthyroidism 05/04/2017   Hypokalemia 07/22/2015   Keratoconjunctivitis sicca of both eyes not specified as Sjogren's 10/13/2017   Left carotid artery occlusion    Lower back pain 07/22/2015   LVH (left ventricular hypertrophy) 03/2016   [facility]   Malignant neoplasm of cervix uteri,  unspecified (HCC) 11/08/2011   Mixed hyperlipidemia 11/15/2016   Takayasu's arteritis (HCC)    Thyroid  disease    Hyperthyroid   Type 2 diabetes mellitus without complication, without long-term current use of insulin  (HCC) 11/15/2016   Vitreous floaters of both eyes 10/13/2017   Family History  Problem Relation Age of Onset   Diabetes Mother    Hypertension Mother    Arthritis Mother    Diabetes Father    Hypertension Father    Hypertension Sister    Hypertension Brother    Hypertension Sister    Hypertension Sister    Hypertension Sister    Hypertension Brother    Past Surgical History:  Procedure Laterality Date   HEMORRHOID SURGERY      Short Social History:  Social History   Tobacco Use   Smoking status: Former    Current packs/day: 0.00    Types: Cigarettes    Quit date: 10/17/2010    Years since quitting: 12.9   Smokeless tobacco: Never  Substance Use Topics   Alcohol use: No    Allergies  Allergen Reactions   Pepcid Complete [Famotidine-Ca Carb-Mag Hydrox] Swelling   Famotidine Swelling   Colchicine Palpitations    Current Outpatient Medications  Medication Sig Dispense Refill   albuterol (VENTOLIN HFA) 108 (90 Base) MCG/ACT inhaler Inhale 2 puffs into the lungs every 6 (six) hours as needed for wheezing or shortness of breath.     amLODipine (NORVASC) 5 MG tablet Take 5 mg by mouth daily.     cholecalciferol (VITAMIN D3) 25 MCG (1000 UNIT) tablet Take 1,000  Units by mouth daily.     CONTOUR NEXT TEST test strip USE 1 STRIP TO CHECK GLUCOSE ONCE DAILY     empagliflozin (JARDIANCE) 25 MG TABS tablet Take 25 mg by mouth daily.     LIPITOR 10 MG tablet Take 10 mg by mouth daily.     losartan (COZAAR) 50 MG tablet Take 50 mg by mouth daily.     methimazole (TAPAZOLE) 10 MG tablet Take 20 mg by mouth daily.     metoprolol  succinate (TOPROL -XL) 25 MG 24 hr tablet Take 50 mg by mouth daily.     Omega 3 1200 MG CAPS Take 1,200 mg by mouth daily.     omeprazole  (PRILOSEC) 40 MG capsule Take 40 mg by mouth daily.     potassium chloride SA (KLOR-CON M) 20 MEQ tablet Take 20 mEq by mouth daily.     predniSONE  (DELTASONE ) 5 MG tablet Take 5 mg by mouth daily as needed (inflammation).     No current facility-administered medications for this visit.    REVIEW OF SYSTEMS  All other systems were reviewed and are negative     Objective:  Objective   There were no vitals filed for this visit. There is no height or weight on file to calculate BMI.  Physical Exam General: no acute distress Cardiac: hemodynamically stable Pulm: normal work of breathing Abdomen: non-tender, no pulsatile mass*** Neuro: alert, no focal deficit Extremities: no edema, cyanosis or wounds*** Vascular:   Right: ***  Left: ***  Data: CT w/ contrast independently reviewed Possible bilateral carotid occlusions, evaluation limited as its not a dedicated CTA  Duplex ***     Assessment/Plan:   Molly Hardin is a 53 y.o. female with ***  Recommendations to optimize cardiovascular risk: Abstinence from all tobacco products. Blood glucose control with goal A1c < 7%. Blood pressure control with goal blood pressure < 140/90 mmHg. Lipid reduction therapy with goal LDL-C <100 mg/dL  Aspirin  81mg  PO QD.  Atorvastatin 40-80mg  PO QD (or other "high intensity" statin therapy).   Philipp Brawn MD [facility]

## 2023-09-22 ENCOUNTER — Ambulatory Visit (HOSPITAL_COMMUNITY)
Admission: RE | Admit: 2023-09-22 | Discharge: 2023-09-22 | Disposition: A | Discharge status: Home / Self Care | Source: Ambulatory Visit | Attending: Vascular Surgery | Admitting: Vascular Surgery

## 2023-09-22 ENCOUNTER — Encounter: Payer: Self-pay | Admitting: Vascular Surgery

## 2023-09-22 ENCOUNTER — Ambulatory Visit: Attending: Vascular Surgery | Admitting: Vascular Surgery

## 2023-09-22 VITALS — BP 191/78 | HR 71 | Temp 98.1°F | Ht 61.0 in | Wt 160.4 lb

## 2023-09-22 DIAGNOSIS — I6523 Occlusion and stenosis of bilateral carotid arteries: Secondary | ICD-10-CM

## 2023-09-22 DIAGNOSIS — I6522 Occlusion and stenosis of left carotid artery: Secondary | ICD-10-CM
